# Patient Record
Sex: Female | Born: 1965 | Race: Black or African American | Hispanic: No | Marital: Single | Smoking: Former smoker
Health system: Southern US, Community
[De-identification: ages and names within clinical notes are randomized; demographics above are authoritative.]

## PROBLEM LIST (undated history)

## (undated) DIAGNOSIS — M545 Low back pain, unspecified: Secondary | ICD-10-CM

## (undated) DIAGNOSIS — G5602 Carpal tunnel syndrome, left upper limb: Secondary | ICD-10-CM

## (undated) DIAGNOSIS — G8929 Other chronic pain: Secondary | ICD-10-CM

## (undated) DIAGNOSIS — E119 Type 2 diabetes mellitus without complications: Secondary | ICD-10-CM

## (undated) DIAGNOSIS — D649 Anemia, unspecified: Secondary | ICD-10-CM

## (undated) HISTORY — PX: REDUCTION MAMMAPLASTY: SUR839

---
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
## 1997-11-01 ENCOUNTER — Encounter: Admission: RE | Admit: 1997-11-01 | Discharge: 1997-11-01 | Payer: Self-pay | Admitting: Sports Medicine

## 1997-11-01 ENCOUNTER — Other Ambulatory Visit: Admission: RE | Admit: 1997-11-01 | Discharge: 1997-11-01 | Payer: Self-pay | Admitting: *Deleted

## 1997-11-23 ENCOUNTER — Encounter: Admission: RE | Admit: 1997-11-23 | Discharge: 1997-11-23 | Payer: Self-pay | Admitting: Family Medicine

## 1998-05-11 ENCOUNTER — Encounter: Admission: RE | Admit: 1998-05-11 | Discharge: 1998-05-11 | Payer: Self-pay | Admitting: Sports Medicine

## 1998-06-26 ENCOUNTER — Encounter: Payer: Self-pay | Admitting: Emergency Medicine

## 1998-06-26 ENCOUNTER — Emergency Department (HOSPITAL_COMMUNITY): Admission: EM | Admit: 1998-06-26 | Discharge: 1998-06-26 | Payer: Self-pay | Admitting: Emergency Medicine

## 1999-02-07 ENCOUNTER — Emergency Department (HOSPITAL_COMMUNITY): Admission: EM | Admit: 1999-02-07 | Discharge: 1999-02-07 | Payer: Self-pay | Admitting: Emergency Medicine

## 1999-03-30 ENCOUNTER — Emergency Department (HOSPITAL_COMMUNITY): Admission: EM | Admit: 1999-03-30 | Discharge: 1999-03-31 | Payer: Self-pay | Admitting: Emergency Medicine

## 1999-04-03 ENCOUNTER — Encounter: Admission: RE | Admit: 1999-04-03 | Discharge: 1999-04-03 | Payer: Self-pay | Admitting: Sports Medicine

## 2000-03-21 ENCOUNTER — Encounter (INDEPENDENT_AMBULATORY_CARE_PROVIDER_SITE_OTHER): Payer: Self-pay | Admitting: *Deleted

## 2000-03-21 LAB — CONVERTED CEMR LAB

## 2000-03-31 ENCOUNTER — Other Ambulatory Visit: Admission: RE | Admit: 2000-03-31 | Discharge: 2000-03-31 | Payer: Self-pay | Admitting: *Deleted

## 2000-03-31 ENCOUNTER — Encounter: Admission: RE | Admit: 2000-03-31 | Discharge: 2000-03-31 | Payer: Self-pay | Admitting: Family Medicine

## 2000-04-07 ENCOUNTER — Encounter: Admission: RE | Admit: 2000-04-07 | Discharge: 2000-04-07 | Payer: Self-pay | Admitting: Family Medicine

## 2000-04-08 ENCOUNTER — Encounter: Admission: RE | Admit: 2000-04-08 | Discharge: 2000-04-08 | Payer: Self-pay | Admitting: Sports Medicine

## 2000-04-08 ENCOUNTER — Encounter: Payer: Self-pay | Admitting: Sports Medicine

## 2000-04-15 ENCOUNTER — Ambulatory Visit (HOSPITAL_COMMUNITY): Admission: RE | Admit: 2000-04-15 | Discharge: 2000-04-15 | Payer: Self-pay | Admitting: Sports Medicine

## 2000-04-15 ENCOUNTER — Encounter: Admission: RE | Admit: 2000-04-15 | Discharge: 2000-04-15 | Payer: Self-pay | Admitting: Sports Medicine

## 2000-08-16 ENCOUNTER — Emergency Department (HOSPITAL_COMMUNITY): Admission: EM | Admit: 2000-08-16 | Discharge: 2000-08-17 | Payer: Self-pay | Admitting: Emergency Medicine

## 2000-08-17 ENCOUNTER — Encounter: Payer: Self-pay | Admitting: Emergency Medicine

## 2001-03-05 ENCOUNTER — Encounter: Admission: RE | Admit: 2001-03-05 | Discharge: 2001-03-05 | Payer: Self-pay | Admitting: Family Medicine

## 2001-07-24 ENCOUNTER — Encounter: Payer: Self-pay | Admitting: Emergency Medicine

## 2001-07-24 ENCOUNTER — Emergency Department (HOSPITAL_COMMUNITY): Admission: EM | Admit: 2001-07-24 | Discharge: 2001-07-24 | Payer: Self-pay | Admitting: Emergency Medicine

## 2002-03-17 ENCOUNTER — Emergency Department (HOSPITAL_COMMUNITY): Admission: EM | Admit: 2002-03-17 | Discharge: 2002-03-17 | Payer: Self-pay | Admitting: Emergency Medicine

## 2002-03-17 ENCOUNTER — Encounter: Payer: Self-pay | Admitting: Emergency Medicine

## 2004-06-04 ENCOUNTER — Emergency Department (HOSPITAL_COMMUNITY): Admission: EM | Admit: 2004-06-04 | Discharge: 2004-06-04 | Payer: Self-pay | Admitting: *Deleted

## 2006-04-17 DIAGNOSIS — D62 Acute posthemorrhagic anemia: Secondary | ICD-10-CM

## 2006-04-17 DIAGNOSIS — E669 Obesity, unspecified: Secondary | ICD-10-CM | POA: Insufficient documentation

## 2006-04-17 DIAGNOSIS — K210 Gastro-esophageal reflux disease with esophagitis: Secondary | ICD-10-CM

## 2006-04-18 ENCOUNTER — Encounter (INDEPENDENT_AMBULATORY_CARE_PROVIDER_SITE_OTHER): Payer: Self-pay | Admitting: *Deleted

## 2006-04-22 ENCOUNTER — Emergency Department (HOSPITAL_COMMUNITY): Admission: EM | Admit: 2006-04-22 | Discharge: 2006-04-22 | Payer: Self-pay | Admitting: Emergency Medicine

## 2006-08-04 ENCOUNTER — Encounter: Admission: RE | Admit: 2006-08-04 | Discharge: 2006-08-04 | Payer: Self-pay | Admitting: Orthopedic Surgery

## 2007-05-25 ENCOUNTER — Encounter: Admission: RE | Admit: 2007-05-25 | Discharge: 2007-05-25 | Payer: Self-pay | Admitting: Obstetrics and Gynecology

## 2007-06-25 ENCOUNTER — Encounter: Admission: RE | Admit: 2007-06-25 | Discharge: 2007-06-25 | Payer: Self-pay | Admitting: Obstetrics and Gynecology

## 2008-01-01 ENCOUNTER — Encounter: Admission: RE | Admit: 2008-01-01 | Discharge: 2008-01-01 | Payer: Self-pay | Admitting: Obstetrics and Gynecology

## 2008-02-19 HISTORY — PX: CARPAL TUNNEL RELEASE: SHX101

## 2008-05-25 ENCOUNTER — Encounter: Admission: RE | Admit: 2008-05-25 | Discharge: 2008-05-25 | Payer: Self-pay | Admitting: Obstetrics and Gynecology

## 2008-07-19 HISTORY — PX: ANTERIOR CERVICAL DECOMP/DISCECTOMY FUSION: SHX1161

## 2008-08-16 ENCOUNTER — Ambulatory Visit (HOSPITAL_COMMUNITY): Admission: RE | Admit: 2008-08-16 | Discharge: 2008-08-17 | Payer: Self-pay | Admitting: Neurosurgery

## 2008-10-19 HISTORY — PX: VAGINAL HYSTERECTOMY: SUR661

## 2008-11-09 ENCOUNTER — Encounter (INDEPENDENT_AMBULATORY_CARE_PROVIDER_SITE_OTHER): Payer: Self-pay | Admitting: Obstetrics and Gynecology

## 2008-11-09 ENCOUNTER — Ambulatory Visit (HOSPITAL_COMMUNITY): Admission: RE | Admit: 2008-11-09 | Discharge: 2008-11-10 | Payer: Self-pay | Admitting: Obstetrics and Gynecology

## 2008-11-18 ENCOUNTER — Ambulatory Visit (HOSPITAL_BASED_OUTPATIENT_CLINIC_OR_DEPARTMENT_OTHER): Admission: RE | Admit: 2008-11-18 | Discharge: 2008-11-18 | Payer: Self-pay | Admitting: Orthopedic Surgery

## 2008-11-18 HISTORY — PX: SHOULDER ARTHROSCOPY W/ ROTATOR CUFF REPAIR: SHX2400

## 2008-12-10 ENCOUNTER — Emergency Department (HOSPITAL_COMMUNITY): Admission: EM | Admit: 2008-12-10 | Discharge: 2008-12-10 | Payer: Self-pay | Admitting: Family Medicine

## 2009-02-23 ENCOUNTER — Encounter: Admission: RE | Admit: 2009-02-23 | Discharge: 2009-02-23 | Payer: Self-pay | Admitting: Family Medicine

## 2009-06-26 ENCOUNTER — Encounter: Admission: RE | Admit: 2009-06-26 | Discharge: 2009-06-26 | Payer: Self-pay | Admitting: Obstetrics and Gynecology

## 2010-02-18 HISTORY — PX: SHOULDER OPEN ROTATOR CUFF REPAIR: SHX2407

## 2010-02-22 ENCOUNTER — Ambulatory Visit
Admission: RE | Admit: 2010-02-22 | Discharge: 2010-02-22 | Payer: Self-pay | Source: Home / Self Care | Attending: Orthopedic Surgery | Admitting: Orthopedic Surgery

## 2010-02-22 LAB — POCT HEMOGLOBIN-HEMACUE: Hemoglobin: 13.8 g/dL (ref 12.0–15.0)

## 2010-05-24 LAB — POCT HEMOGLOBIN-HEMACUE: Hemoglobin: 8.9 g/dL — ABNORMAL LOW (ref 12.0–15.0)

## 2010-05-24 LAB — POCT I-STAT, CHEM 8
BUN: 7 mg/dL (ref 6–23)
Calcium, Ion: 1.18 mmol/L (ref 1.12–1.32)
Chloride: 103 mEq/L (ref 96–112)
Glucose, Bld: 87 mg/dL (ref 70–99)
HCT: 29 % — ABNORMAL LOW (ref 36.0–46.0)
TCO2: 25 mmol/L (ref 0–100)

## 2010-05-25 LAB — CBC
MCHC: 32.4 g/dL (ref 30.0–36.0)
MCV: 80.1 fL (ref 78.0–100.0)
Platelets: 407 10*3/uL — ABNORMAL HIGH (ref 150–400)
Platelets: 416 10*3/uL — ABNORMAL HIGH (ref 150–400)
RBC: 3.22 MIL/uL — ABNORMAL LOW (ref 3.87–5.11)
RBC: 3.89 MIL/uL (ref 3.87–5.11)
WBC: 15.3 10*3/uL — ABNORMAL HIGH (ref 4.0–10.5)

## 2010-05-25 LAB — BASIC METABOLIC PANEL
BUN: 7 mg/dL (ref 6–23)
CO2: 26 mEq/L (ref 19–32)
Calcium: 9 mg/dL (ref 8.4–10.5)
Chloride: 105 mEq/L (ref 96–112)
Creatinine, Ser: 0.46 mg/dL (ref 0.4–1.2)
GFR calc Af Amer: >60 mL/min (ref >60)

## 2010-05-28 LAB — BASIC METABOLIC PANEL
CO2: 26 mEq/L (ref 19–32)
Chloride: 106 mEq/L (ref 96–112)
GFR calc Af Amer: >60 mL/min (ref >60)
Potassium: 4 mEq/L (ref 3.5–5.1)
Sodium: 139 mEq/L (ref 135–145)

## 2010-05-28 LAB — CBC
HCT: 30.4 % — ABNORMAL LOW (ref 36.0–46.0)
Hemoglobin: 10 g/dL — ABNORMAL LOW (ref 12.0–15.0)
MCHC: 32.7 g/dL (ref 30.0–36.0)
MCV: 79.4 fL (ref 78.0–100.0)
RBC: 3.84 MIL/uL — ABNORMAL LOW (ref 3.87–5.11)
WBC: 8.9 10*3/uL (ref 4.0–10.5)

## 2010-05-28 IMAGING — MG MM DIAGNOSTIC BILATERAL
6 series · 6 of 6 positions shown · non-contrast
Comparison: With priors

CLINICAL DATA: Short-term interval follow-up of the left breast

DIGITAL DIAGNOSTIC BILATERAL MAMMOGRAM WITH CAD

[R CC (1 of 2)]
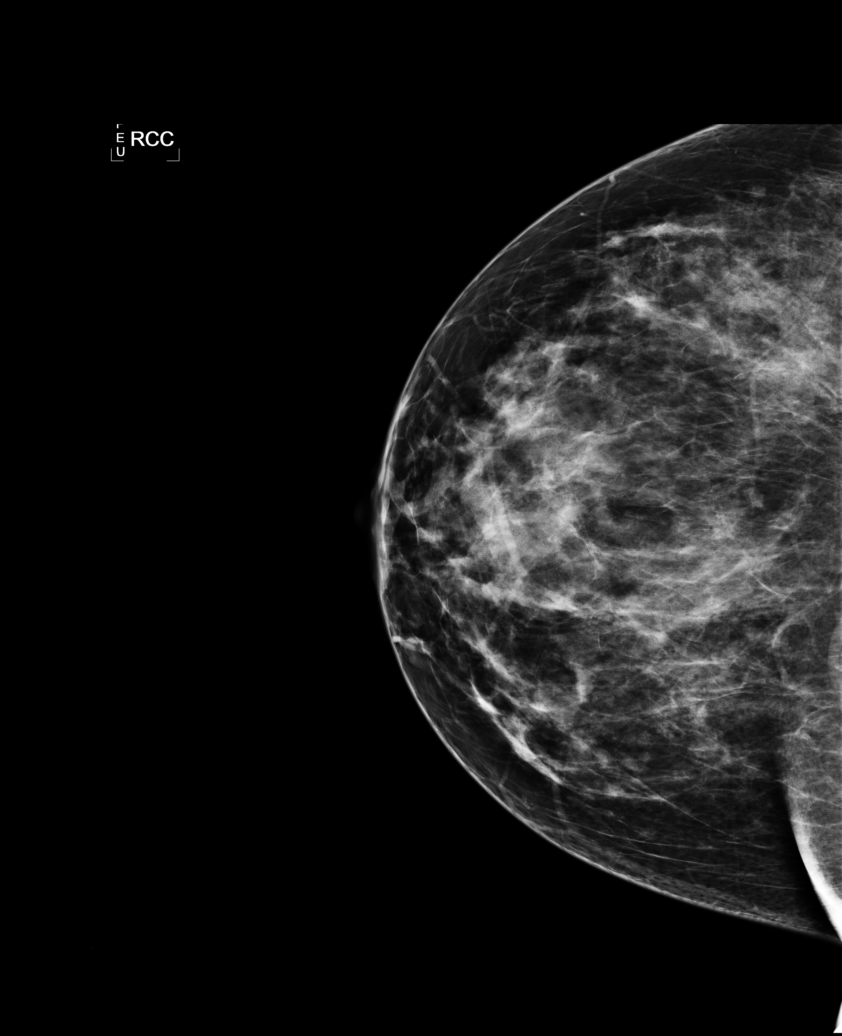

[L CC]
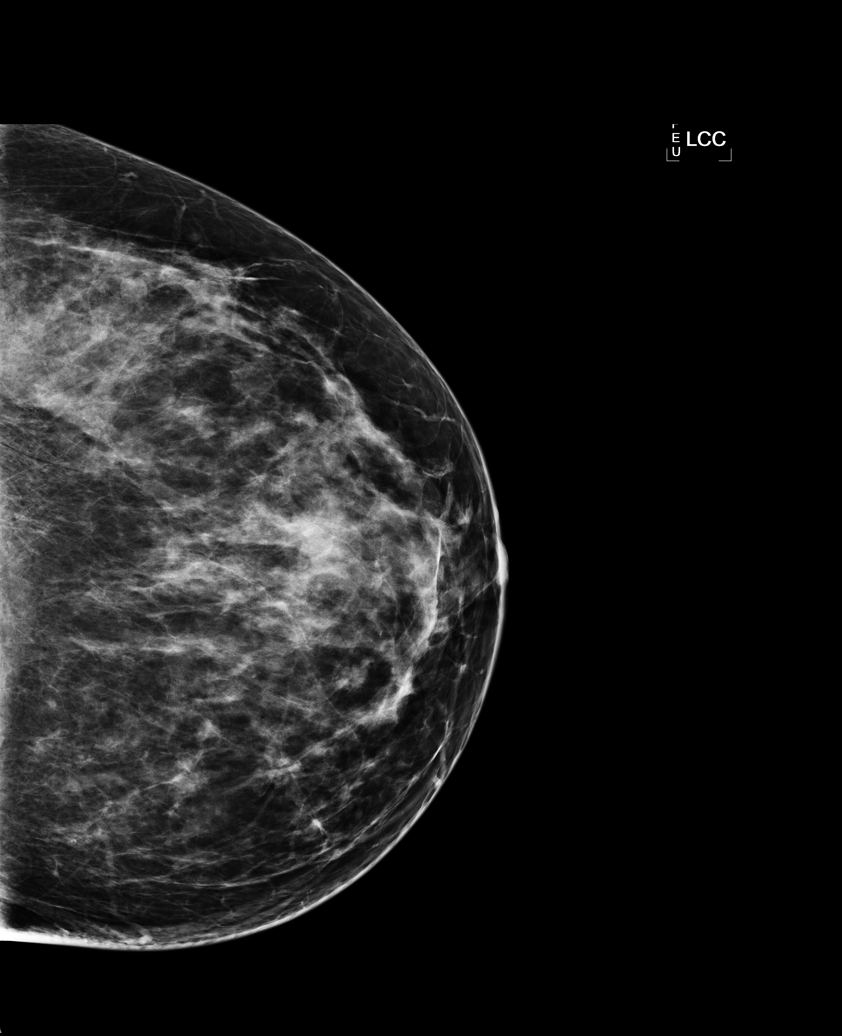

[L MLO]
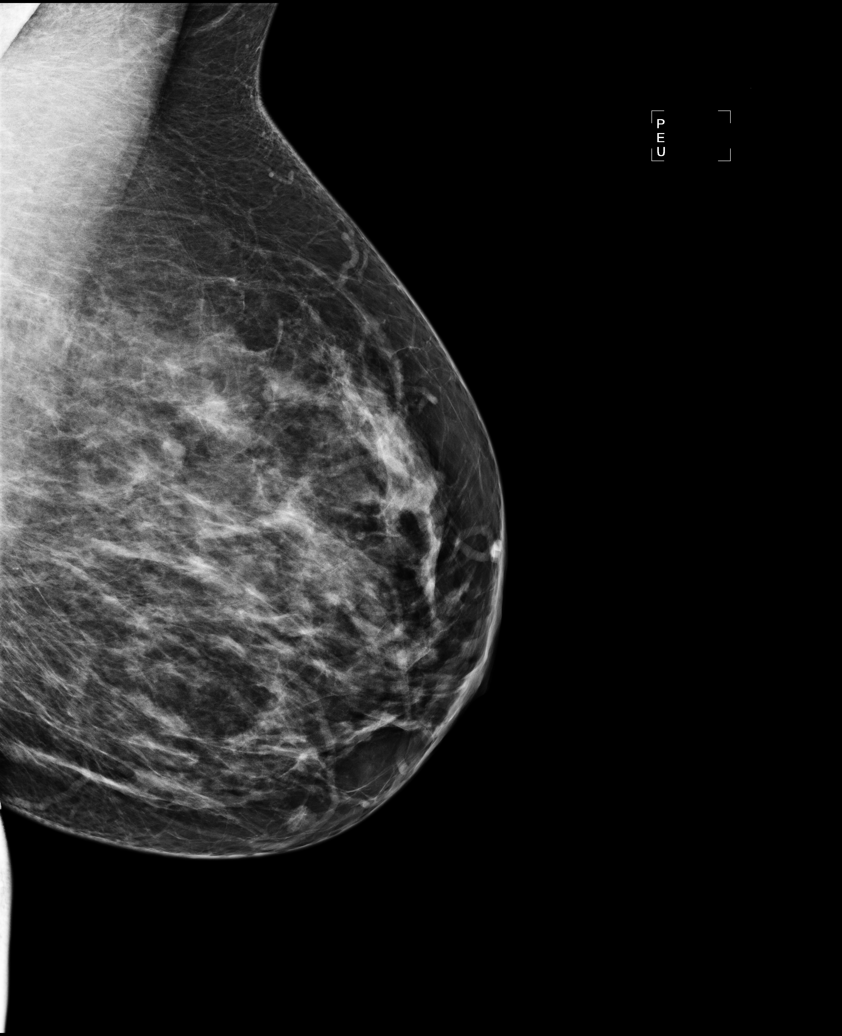

[R MLO (1 of 2)]
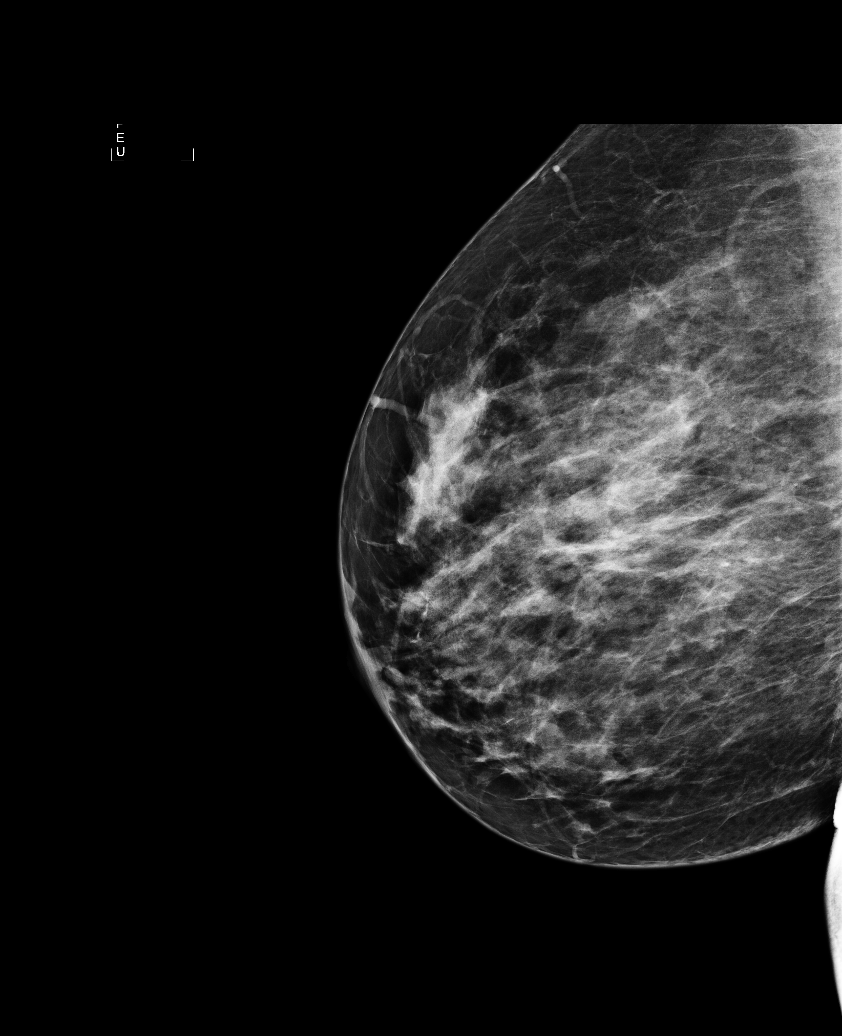

[R CC (2 of 2)]
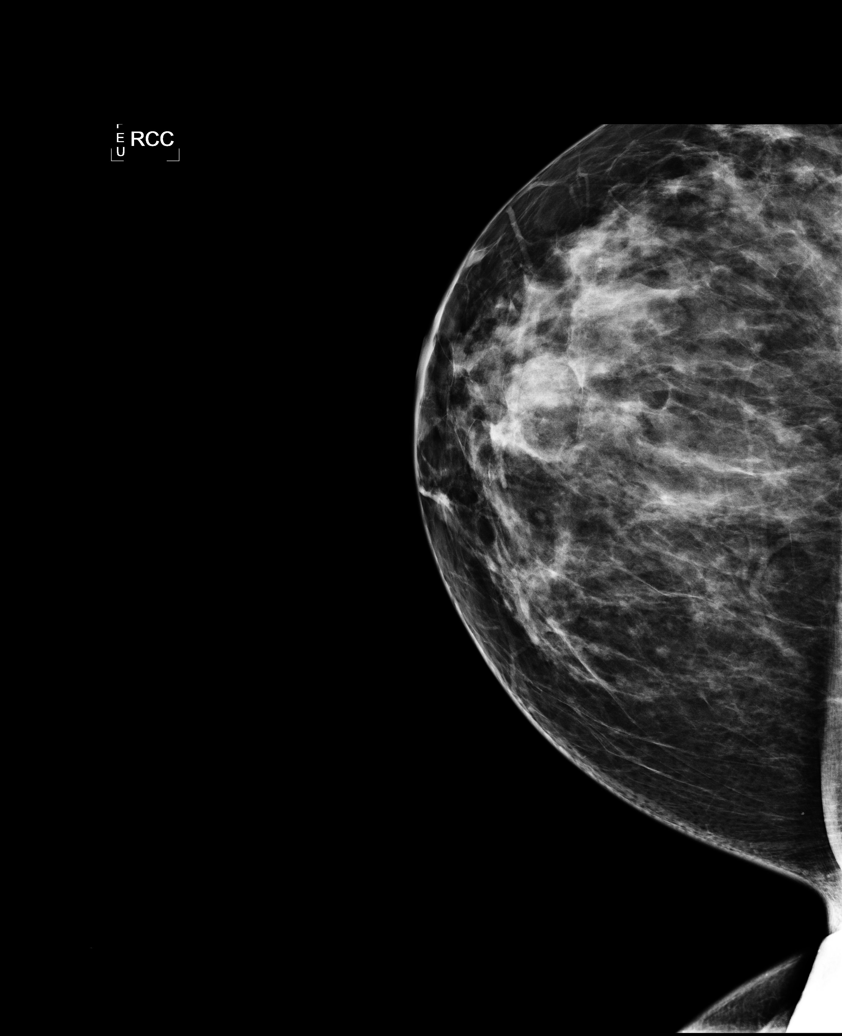

[R MLO (2 of 2)]
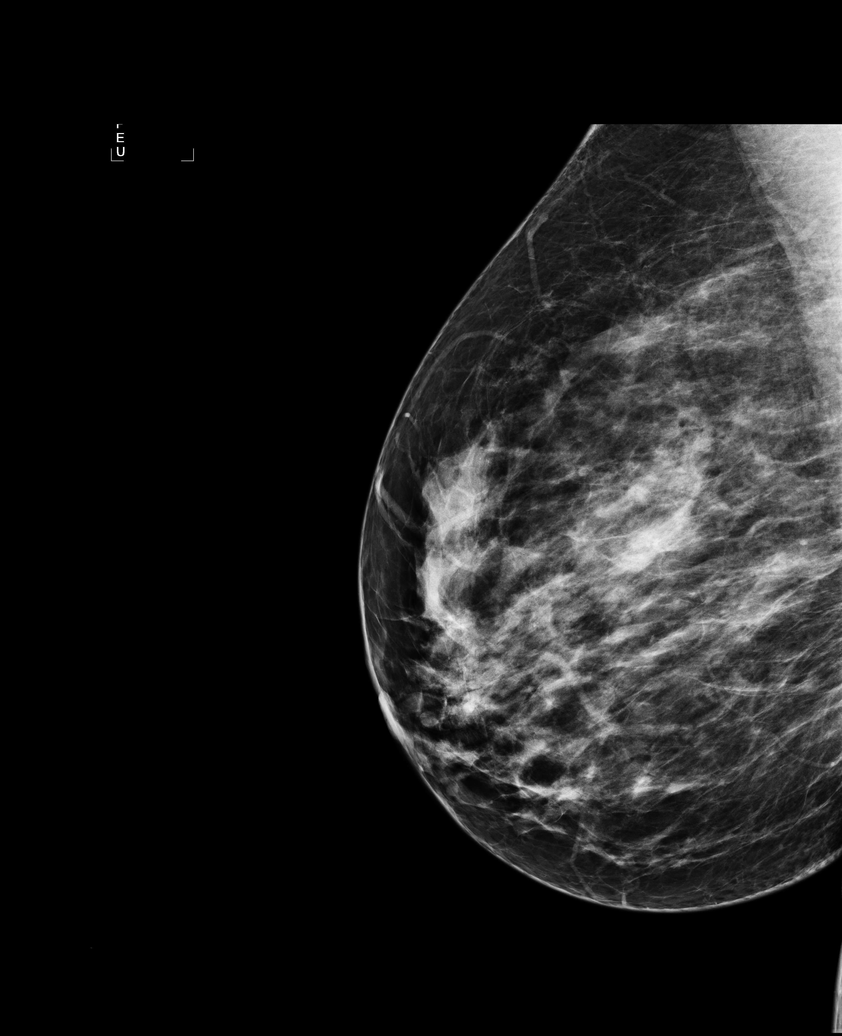

[6 of 6 positions shown; findings below may reference images not displayed]

FINDINGS: There are scattered fibroglandular densities.  No new
suspicious mass or malignant-type microcalcifications are seen in
either breast.  The 8 mm nodule seen in the inferior aspect of the
left breast is unchanged when compared to the mammogram dated
05/25/2007 and is felt to be benign.
IMPRESSION: No evidence of malignancy in either breast.  Screening mammogram in
1 year is recommended.

BI-RADS CATEGORY 2:  Benign finding(s).

## 2010-07-03 NOTE — Op Note (Signed)
[name], [name]               ACCOUNT NO.:  [id number]   MEDICAL RECORD NO.:  [id number]          PATIENT TYPE:  AMB   LOCATION:  SDS                          FACILITY:  [facility]   PHYSICIAN:  Danae Orleans. Venetia Maxon, M.D.  DATE OF BIRTH:  07/05/1965   DATE OF PROCEDURE:  08/16/2008  DATE OF DISCHARGE:                               OPERATIVE REPORT   PREOPERATIVE DIAGNOSES:  Herniated cervical disk spondylosis,  degenerative disease, and radiculopathy, C4-C5 and C5-C6 levels.   POSTOPERATIVE DIAGNOSES:  Herniated cervical disk spondylosis,  degenerative disease, and radiculopathy, C4-C5 and C5-C6 levels.   PROCEDURE:  Anterior cervical decompression and fusion C4-C5 and C5-C6  with allograft bone graft, morselized bone autograft, EquivaBone, and  anterior cervical plate.   SURGEON:  Danae Orleans. Venetia Maxon, MD   ASSISTANTS:  1. Coletta Memos, MD  2. Georgiann Cocker, RN   ANESTHESIA:  General endotracheal anesthesia.   ESTIMATED BLOOD LOSS:  Minimal.   COMPLICATIONS:  None.   DISPOSITION:  Recovery.   INDICATIONS:  [name] is a 45 year old woman with left C6  radiculopathy and right C5 radiculopathy, with herniated cervical disk  at C4-C5 and C5-C6 levels.  It was elected take her to surgery for  anterior cervical decompression and fusion at these affected levels.   PROCEDURE IN DETAIL:  [name] was brought to the operating room.  Following a satisfactory and uncomplicated induction of general  endotracheal anesthesia and placement of intravenous lines, the patient  was placed in supine position on the operating table.  The neck was  placed in slight extension.  She was placed in 5 pounds of halter  traction.  Her neck was then prepped and draped in the usual sterile  fashion.  The area of planned incision was infiltrated with lidocaine.  Incision was made from midline to the anterior border of  sternocleidomastoid muscle on the left side of midline and carried  through sharply  through the platysma layer.  Subplatysmal dissection was  performed exposing the anterior borders of sternocleidomastoid muscle  using blunt dissection.  The carotid sheath was kept lateral and trachea  and esophagus kept medial exposing the anterior cervical spine.  Bent  spinal needles were placed what we felt to be the C4-C5 and C5-C6 levels  and this was confirmed on intraoperative x-ray.  Subsequently longus  colli muscles were taken down from the anterior cervical spine from C4  to C6 bilaterally using electrocautery and Key elevator.  A self-  retaining Shadow-Line retractor was placed along up and down retractors.  The interspaces at C4-C5 and C5-C6 were incised with #15 blade.  Disk  material was removed in piecemeal fashion.  Distraction pins were placed  at C4 and C6 and using gentle distraction, the interspaces were opened  and cleared of investing disk and cartilaginous material using a variety  of Epstein curettes.  Endplates were decorticated with high-speed drill  and uncinate spurs were drilled down initially using the operating  microscope at C5-C6 level.  The posterior longitudinal ligament was  detached and removed in a piecemeal fashion.  The spinal cord,  dura, and  both C6 nerve roots were decompressed.  There was significant amount of  spondylitic disk material overlying the C6 nerve root on the left and  this was decompressed widely.  Hemostasis was assured and after trial  sizing with a 7-mm allograft bone wedge which was selected and fashioned  with high-speed drill, packed with morselized bone autograft, Equivabone  inserted in the interspace, and countersunk appropriately.  At the C4-C5  level, similar decompression was performed.  Right-sided disk herniation  was removed at this level and spinal cord dura and both C5 nerve roots  were decompressed.  Hemostasis was again assured and a similar sized  bone graft was fashioned, packed with morselized bone  autograft,  EquivaBone inserted in the interspace, and countersunk appropriately.  A  28-mm anterior cervical spine plate was (Trestle) was affixed to the  anterior cervical spine using 40-mm variable angle screws, two at C4,  two at C5, and two at C6, all screws had excellent purchase.  Locking  mechanisms were engaged.  The traction weight was removed prior to  placing the plate.  Final x-ray was obtained which showed upper aspect  of construct to be well-positioned.  The lower aspect was not well  visualized because of the patient's large body habitus.  The soft  tissues were inspected and found to be in good repair.  Hemostasis was  assured with bipolar electrocautery.  The wound was irrigated.  The  platysma layer was closed with 3-0 Vicryl sutures.  Skin edges were  reapproximated with 3-0 Vicryl subcuticular stitch.  The wound was  dressed with Dermabond.  The patient was extubated in the operating room  and taken to recovery in stable and satisfactory condition having  tolerated the operation well.  Counts were correct at the end of the  case.      Danae Orleans. Venetia Maxon, M.D.  Electronically Signed     JDS/MEDQ  D:  08/16/2008  T:  08/17/2008  Job:  [id number]

## 2010-07-10 ENCOUNTER — Other Ambulatory Visit: Payer: Self-pay | Admitting: Obstetrics and Gynecology

## 2010-07-10 DIAGNOSIS — Z1231 Encounter for screening mammogram for malignant neoplasm of breast: Secondary | ICD-10-CM

## 2010-07-20 ENCOUNTER — Ambulatory Visit: Payer: Self-pay

## 2010-07-26 ENCOUNTER — Ambulatory Visit: Payer: Self-pay

## 2010-07-30 ENCOUNTER — Ambulatory Visit
Admission: RE | Admit: 2010-07-30 | Discharge: 2010-07-30 | Disposition: A | Discharge status: Home / Self Care | Payer: BC Managed Care – PPO | Source: Ambulatory Visit | Attending: Obstetrics and Gynecology | Admitting: Obstetrics and Gynecology

## 2010-07-30 DIAGNOSIS — Z1231 Encounter for screening mammogram for malignant neoplasm of breast: Secondary | ICD-10-CM

## 2010-11-10 ENCOUNTER — Inpatient Hospital Stay (INDEPENDENT_AMBULATORY_CARE_PROVIDER_SITE_OTHER)
Admission: RE | Admit: 2010-11-10 | Discharge: 2010-11-10 | Disposition: A | Discharge status: Home / Self Care | Payer: BC Managed Care – PPO | Source: Ambulatory Visit | Attending: Emergency Medicine | Admitting: Emergency Medicine

## 2010-11-10 ENCOUNTER — Ambulatory Visit (INDEPENDENT_AMBULATORY_CARE_PROVIDER_SITE_OTHER): Payer: BC Managed Care – PPO

## 2010-11-10 DIAGNOSIS — J069 Acute upper respiratory infection, unspecified: Secondary | ICD-10-CM

## 2010-11-10 DIAGNOSIS — K5289 Other specified noninfective gastroenteritis and colitis: Secondary | ICD-10-CM

## 2010-11-10 DIAGNOSIS — J40 Bronchitis, not specified as acute or chronic: Secondary | ICD-10-CM

## 2010-11-10 LAB — CBC
MCH: 31.4 pg (ref 26.0–34.0)
MCHC: 34.4 g/dL (ref 30.0–36.0)
MCV: 91.3 fL (ref 78.0–100.0)
Platelets: 332 10*3/uL (ref 150–400)
RBC: 4.14 MIL/uL (ref 3.87–5.11)

## 2010-11-10 LAB — DIFFERENTIAL
Eosinophils Absolute: 0 10*3/uL (ref 0.0–0.7)
Lymphs Abs: 1.5 10*3/uL (ref 0.7–4.0)
Monocytes Absolute: 0.7 10*3/uL (ref 0.1–1.0)
Monocytes Relative: 7 % (ref 3–12)
Neutrophils Relative %: 78 % — ABNORMAL HIGH (ref 43–77)

## 2010-11-10 LAB — BASIC METABOLIC PANEL
CO2: 25 mEq/L (ref 19–32)
Calcium: 9.6 mg/dL (ref 8.4–10.5)
Creatinine, Ser: <0.47 mg/dL — ABNORMAL LOW (ref 0.50–1.10)
Glucose, Bld: 83 mg/dL (ref 70–99)

## 2011-04-11 ENCOUNTER — Other Ambulatory Visit: Payer: Self-pay | Admitting: Neurosurgery

## 2011-04-25 ENCOUNTER — Encounter (HOSPITAL_COMMUNITY): Payer: Self-pay

## 2011-04-25 ENCOUNTER — Encounter (HOSPITAL_COMMUNITY)
Admission: RE | Admit: 2011-04-25 | Discharge: 2011-04-25 | Disposition: A | Discharge status: Home / Self Care | Payer: Worker's Compensation | Source: Ambulatory Visit | Attending: Neurosurgery | Admitting: Neurosurgery

## 2011-04-25 HISTORY — DX: Anemia, unspecified: D64.9

## 2011-04-25 LAB — HCG, SERUM, QUALITATIVE: Preg, Serum: NEGATIVE

## 2011-04-25 LAB — CBC
Platelets: 302 10*3/uL (ref 150–400)
RDW: 13.5 % (ref 11.5–15.5)
WBC: 9.7 10*3/uL (ref 4.0–10.5)

## 2011-04-25 LAB — BASIC METABOLIC PANEL
Calcium: 9.9 mg/dL (ref 8.4–10.5)
Creatinine, Ser: 0.62 mg/dL (ref 0.50–1.10)
GFR calc Af Amer: >90 mL/min (ref >90)
GFR calc non Af Amer: >90 mL/min (ref >90)
Sodium: 140 mEq/L (ref 135–145)

## 2011-04-25 LAB — TYPE AND SCREEN
ABO/RH(D): A POS
Antibody Screen: NEGATIVE

## 2011-04-25 LAB — SURGICAL PCR SCREEN
MRSA, PCR: NEGATIVE
Staphylococcus aureus: NEGATIVE

## 2011-04-25 NOTE — Pre-Procedure Instructions (Signed)
20 [name]   04/25/2011   Your procedure is scheduled on: Friday, MARCH 15TH   Report to [facility] at  5:30  AM.   Call this number if you have problems the morning of surgery: [phone]   Remember:   Do not eat food:After Midnight. Thursday   May have clear liquids: up to 4 Hours before arrival TIME 1:30 AM   Clear liquids include soda, tea, black coffee, apple or grape juice, broth.   Take these medicines the morning of surgery with A SIP OF WATER:  ROBAXIN   Do not wear jewelry, make-up or nail polish.   Do not wear lotions, powders, or perfumes. You may wear deodorant.   Do not shave 48 hours prior to surgery.   Do not bring valuables to the hospital.  Contacts, dentures or bridgework may not be worn into surgery.  Leave suitcase in the car. After surgery it may be brought to your room.  For patients admitted to the hospital, checkout time is 11:00 AM the day of discharge.   Patients discharged the day of surgery will not be allowed to drive home.  Name and phone number of your driver: [name] --  DAUGHTER   Special Instructions: CHG Shower Use Special Wash: 1/2 bottle night before surgery and 1/2 bottle morning of surgery.   Please read over the following fact sheets that you were given: Pain Booklet, Blood Transfusion Information, MRSA Information and Surgical Site Infection Prevention

## 2011-04-26 ENCOUNTER — Encounter (HOSPITAL_COMMUNITY): Payer: Self-pay | Admitting: Pharmacy Technician

## 2011-05-02 MED ORDER — CEFAZOLIN SODIUM-DEXTROSE 2-3 GM-% IV SOLR
2.0000 g | INTRAVENOUS | Status: AC
  Administered 2011-05-03: 2 g via INTRAVENOUS
  Filled 2011-05-02: qty 50

## 2011-05-02 NOTE — H&P (Signed)
[name] #[id number] DOB: March 03, 1965   [name] returns today. Her MRI of her thoracic spine is fairly unremarkable apart from shallow right paracentral disc protrusion at T7-T8. Her lumbar MRI shows that she has disc desiccation and circumferential disc bulge at L4-5 with severe facet and ligamentous hypertrophy with fluid in both facet joints and a synovial cyst measuring 6 x 4 x 8 mm. on the left causing left-sided spondylosis and stenosis. There is some milder arthritis at L3-4, but this does not appear to be severe.  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 on extension, 8 mm. on flexion, 7 mm. on neutral alignment.  I believe that this spondylosis and degenerative changes on the right are the basis for her severe back and leg pain. I have recommended based on the fact that she did not improve with injections and nonsurgical management with her shoulder and also with her low back, as well as physical therapy with her low back that she undergo decompression and fusion at the L4-5 level. She says she is in quite severe pain and this is very much affecting her quality of life and ability to function. We will await Workers' Comp authorization before proceeding. She has bilateral lower extremity radicular pain. She also is complaining of low back pain. Pending approval, we will go ahead and fit her for a LSO brace and proceed with decompression and fusion at the L4-5 level.  [name]. [name], M.D./sv cc: Dr. Josephine Igo Dr. Mila Palmer Lavella Lemons, RN, Case Manager, Fax - [phone]    NEUROSURGICAL CONSULTATION   [name] #[id number] DOB: Mar 02, 1965 Jun 22, 2008  HISTORY OF PRESENT ILLNESS: [name] is a 46 year old woman with neck pain who currently works as a Engineer, mining for [employer] who complains of left-sided neck and left arm pain. She notes that she slipped and fell  backwards on 03/19/2008. Since that time she has had left shoulder and left neck pain ranging from 8 to 9 to 10 out of 10 in severity. She has been taking Vicodin every 2 to 3 x per day after work which helps "put me to sleep". She has also used a TENS unit. She saw Dr. Mina Marble and had physical therapy with Dr. Mina Marble. She is currently working with 20-lb. lifting restriction by Dr. Mina Marble. She complains of left-sided pain into her arm and hand since her fall. She has some milder right arm pain.  DIAGNOSTIC STUDIES: Ms. Mini had an MRI of her cervical spine which was performed through [facility] on 05/25/2008 which shows a right-sided posterolateral disc protrusion at C4-5 with right foraminal stenosis and on the left at C5-6 she has posterolateral disc extrusion with associated disc and osteophyte complex and uncovertebral spurring causing left-sided narrowing of the left aspect of the central canal and left foraminal stenosis with left uncovertebral spurring contributing to foraminal stenosis.  REVIEW OF SYSTEMS: Review of Systems was reviewed with the patient. Pertinent positives include swelling in feet or hands, leg pain while walking, arm weakness, leg weakness, back pain, arm pain, joint pain or swelling, neck pain.  PAST MEDICAL HISTORY:   Current Medical Conditions: Significant for no other significant medical problems.   Medications and Allergies: Current medications include B12 Vitamin qd, Phendimetrazine 2 daily, Vicodin 1 or 2 prn and Naproxen bid.   Height and Weight: She is 5', 6 " tall and 189  lbs.  FAMILY HISTORY: Mother is 56 in reasonably good health.  SOCIAL HISTORY: She currently is a nonsmoker, nondrinker, no history of substance abuse.  PHYSICAL EXAMINATION:   General Appearance: [name] is a pleasant, cooperative woman in no acute distress.   Blood Pressure and Pulse: Her blood pressure is 128/88. Heart rate is 76 and regular.   HEENT - normocephalic,  atraumatic. The pupils are equal, round and reactive to light. The extraocular muscles are intact. Sclerae - white. Conjunctiva - pink. Oropharynx benign. Uvula midline.   Neck - left parascapular discomfort and positive left Spurling's maneuver. She has mildly positive left shoulder impingement testing.   Respiratory - there is normal respiratory effort with good intercostal function. Lungs are clear to auscultation. There are no rales, rhonchi or wheezes.   Cardiovascular - the heart has regular rate and rhythm to auscultation. No murmurs are appreciated. There is no extremity edema, cyanosis or clubbing. There are palpable pedal pulses.   Abdomen - soft, nontender, no hepatosplenomegaly appreciated or masses. There are active bowel sounds. No guarding or rebound.   Musculoskeletal Examination - mildly positive left shoulder impingement testing.  NEUROLOGICAL EXAMINATION: The patient is oriented to time, person and place and has good recall of both recent and remote memory with normal attention span and concentration. The patient speaks with clear and fluent speech and exhibits normal language function and appropriate fund of knowledge.   Cranial Nerve Examination - pupils are equal, round and reactive to light. Extraocular movements are full. Visual fields are full to confrontational testing. Facial sensation and facial movement are symmetric and intact. Hearing is intact to finger rub. Palate is upgoing. Shoulder shrug is symmetric. Tongue protrudes in the midline.   Motor Examination - motor strength is 5/5 in the bilateral upper extremities with the exception of 4/5 right deltoid strength, 4/5 left biceps strength, 4+/5 left wrist  extensor strength. In the lower extremities motor strength is 5/5 in hip flexion, extension, quadriceps, hamstrings, plantar flexion, dorsiflexion and extensor hallucis longus.   Sensory Examination - she has increased pin sensation in a right C5 distribution;  otherwise, intact sensation.   Deep Tendon Reflexes - 2 in the right biceps, trace at the left biceps, 2 at the triceps, 2 at the knees, 2 at the ankles and great toes are downgoing to plantar stimulation. Negative Hoffmann's signs.   Cerebellar Examination - normal coordination in upper and lower extremities and normal rapid alternating movements. Romberg test is negative.  IMPRESSION AND RECOMMENDATIONS:  [name] is a 46 year old woman with left greater than right arm pain. She has weakness in her left biceps and wrist extensor and also right-sided deltoid weakness. She has cervical disc herniation at C5-6 on the left which is the most significant finding. She also has right C4-5 posterolateral disc protrusion with right foraminal stenosis. She also has component of rotator cuff syndrome involving her left shoulder.  I recommended to Ms. Wahler, based on the severity of her weakness and significant pain complaints, as well as imaging findings, that she undergo anterior cervical decompression and fusion at C4-5 and C5-6 levels. I went over models and diagrams and reviewed her radiographs with her and discussed pros and cons of surgery, as well as nonsurgical treatment options. She is quite miserable in terms of her level of discomfort. She wishes to go ahead with surgery and we will try to get this approved in an expedited fashion. She is to continue on current work restrictions.  I  have recommended to the patient that she undergo anterior cervical diskectomy and fusion with plating. I went over the diagnostic studies in detail and reviewed surgical models and also discussed the exact nature of the surgical procedure, attendant risks, potential benefits and typical operative and postoperative course. I discussed the risks of surgery which include, but are not limited to, risks of anesthesia, blood loss, infection, injury to various neck structures including the trachea, esophagus, which could cause  temporary or permanent swallowing difficulties and also the potential for perforation of the esophagus which might require operative intervention, larynx, recurrent laryngeal nerve, which could cause either temporary or permanent vocal cord paralysis resulting in either temporary or permanent voice changes, injury to cervical nerve roots, which could cause either temporary or permanent arm pain, numbness and/or weakness. There is a  small chance of injury to the spinal cord which could cause paralysis. There is also the potential for malplacement of instrumentation, fusion failure, need for repeat surgery, degenerative disease at other levels in the neck, failure to relieve the pain, worsening of pain. I also discussed with the patient that she will lose some neck mobility from the surgery. It is typical to stay in the hospital overnight after this operation. Typically she will not be able to drive for 2 weeks after surgery and will come back to see me 2 weeks following surgery with a lateral C-spine x-ray and then for monthly visits to 3 months after surgery. Generally patients are out of work for 4 to 6 weeks following surgery. She will wear a soft collar for 2 weeks after surgery. She will need to be fitted for a soft collar before surgery   [facility]  [name]. [name], M.D.  JDS:sv   cc: Dr. Verlan Friends Dr. Mila Palmer

## 2011-05-03 ENCOUNTER — Encounter (HOSPITAL_COMMUNITY): Payer: Self-pay | Admitting: Anesthesiology

## 2011-05-03 ENCOUNTER — Ambulatory Visit (HOSPITAL_COMMUNITY): Payer: Worker's Compensation

## 2011-05-03 ENCOUNTER — Ambulatory Visit (HOSPITAL_COMMUNITY): Payer: Worker's Compensation | Admitting: Anesthesiology

## 2011-05-03 ENCOUNTER — Encounter (HOSPITAL_COMMUNITY): Payer: Self-pay | Admitting: *Deleted

## 2011-05-03 ENCOUNTER — Encounter (HOSPITAL_COMMUNITY)
Admission: RE | Disposition: A | Discharge status: Home / Self Care | Payer: Self-pay | Source: Ambulatory Visit | Attending: Neurosurgery

## 2011-05-03 ENCOUNTER — Inpatient Hospital Stay (HOSPITAL_COMMUNITY)
Admission: RE | Admit: 2011-05-03 | Discharge: 2011-05-07 | DRG: 460 | Disposition: A | Discharge status: Home / Self Care | Payer: Worker's Compensation | Source: Ambulatory Visit | Attending: Neurosurgery | Admitting: Neurosurgery

## 2011-05-03 DIAGNOSIS — K219 Gastro-esophageal reflux disease without esophagitis: Secondary | ICD-10-CM | POA: Diagnosis present

## 2011-05-03 DIAGNOSIS — M51379 Other intervertebral disc degeneration, lumbosacral region without mention of lumbar back pain or lower extremity pain: Secondary | ICD-10-CM | POA: Diagnosis present

## 2011-05-03 DIAGNOSIS — M4310 Spondylolisthesis, site unspecified: Principal | ICD-10-CM | POA: Diagnosis present

## 2011-05-03 DIAGNOSIS — M5137 Other intervertebral disc degeneration, lumbosacral region: Secondary | ICD-10-CM | POA: Diagnosis present

## 2011-05-03 DIAGNOSIS — M7130 Other bursal cyst, unspecified site: Secondary | ICD-10-CM | POA: Diagnosis present

## 2011-05-03 DIAGNOSIS — M542 Cervicalgia: Secondary | ICD-10-CM | POA: Diagnosis present

## 2011-05-03 DIAGNOSIS — M48061 Spinal stenosis, lumbar region without neurogenic claudication: Secondary | ICD-10-CM | POA: Diagnosis present

## 2011-05-03 HISTORY — DX: Carpal tunnel syndrome, left upper limb: G56.02

## 2011-05-03 HISTORY — DX: Low back pain: M54.5

## 2011-05-03 HISTORY — PX: POSTERIOR FUSION LUMBAR SPINE: SUR632

## 2011-05-03 HISTORY — DX: Other chronic pain: G89.29

## 2011-05-03 HISTORY — DX: Low back pain, unspecified: M54.50

## 2011-05-03 SURGERY — POSTERIOR LUMBAR FUSION 1 LEVEL
Anesthesia: General | Site: Back | Laterality: Bilateral | Wound class: Clean

## 2011-05-03 MED ORDER — DOCUSATE SODIUM 100 MG PO CAPS
100.0000 mg | ORAL_CAPSULE | Freq: Two times a day (BID) | ORAL | Status: DC
  Administered 2011-05-03 – 2011-05-07 (×9): 100 mg via ORAL
  Filled 2011-05-03 (×10): qty 1

## 2011-05-03 MED ORDER — MIDAZOLAM HCL 5 MG/5ML IJ SOLN
INTRAMUSCULAR | Status: DC | PRN
  Administered 2011-05-03: 2 mg via INTRAVENOUS

## 2011-05-03 MED ORDER — GLYCOPYRROLATE 0.2 MG/ML IJ SOLN
INTRAMUSCULAR | Status: DC | PRN
  Administered 2011-05-03: .6 mg via INTRAVENOUS

## 2011-05-03 MED ORDER — PHENOL 1.4 % MT LIQD: 1.0000 | OROMUCOSAL | Status: DC | PRN

## 2011-05-03 MED ORDER — MORPHINE SULFATE (PF) 1 MG/ML IV SOLN
INTRAVENOUS | Status: AC
  Administered 2011-05-03: 11:00:00 via INTRAVENOUS

## 2011-05-03 MED ORDER — LIDOCAINE-EPINEPHRINE 1 %-1:100000 IJ SOLN
INTRAMUSCULAR | Status: DC | PRN
  Administered 2011-05-03: 5 mL

## 2011-05-03 MED ORDER — MORPHINE SULFATE (PF) 1 MG/ML IV SOLN
INTRAVENOUS | Status: DC
  Administered 2011-05-03: 3 mg via INTRAVENOUS
  Administered 2011-05-03: 4.5 mg via INTRAVENOUS
  Administered 2011-05-03: 6 mg via INTRAVENOUS
  Administered 2011-05-04: 03:00:00 via INTRAVENOUS
  Administered 2011-05-04: 1.5 mg via INTRAVENOUS
  Administered 2011-05-04: 4.5 mg via INTRAVENOUS

## 2011-05-03 MED ORDER — POLYETHYLENE GLYCOL 3350 17 GM/SCOOP PO POWD
17.0000 g | Freq: Every day | ORAL | Status: DC
  Administered 2011-05-03 – 2011-05-07 (×5): 17 g via ORAL
  Filled 2011-05-03: qty 255

## 2011-05-03 MED ORDER — EPHEDRINE SULFATE 50 MG/ML IJ SOLN
INTRAMUSCULAR | Status: DC | PRN
  Administered 2011-05-03: 5 mg via INTRAVENOUS
  Administered 2011-05-03: 7.5 mg via INTRAVENOUS
  Administered 2011-05-03: 5 mg via INTRAVENOUS

## 2011-05-03 MED ORDER — SODIUM CHLORIDE 0.9 % IJ SOLN: 9.0000 mL | INTRAMUSCULAR | Status: DC | PRN

## 2011-05-03 MED ORDER — SODIUM CHLORIDE 0.9 % IV SOLN
INTRAVENOUS | Status: AC
  Filled 2011-05-03: qty 500

## 2011-05-03 MED ORDER — LIDOCAINE HCL (CARDIAC) 20 MG/ML IV SOLN
INTRAVENOUS | Status: DC | PRN
  Administered 2011-05-03 (×2): 50 mg via INTRAVENOUS

## 2011-05-03 MED ORDER — ROCURONIUM BROMIDE 100 MG/10ML IV SOLN
INTRAVENOUS | Status: DC | PRN
  Administered 2011-05-03: 50 mg via INTRAVENOUS

## 2011-05-03 MED ORDER — MORPHINE SULFATE 2 MG/ML IJ SOLN: 0.0500 mg/kg | INTRAMUSCULAR | Status: DC | PRN

## 2011-05-03 MED ORDER — ACETAMINOPHEN 325 MG PO TABS
650.0000 mg | ORAL_TABLET | ORAL | Status: DC | PRN
  Filled 2011-05-03: qty 2

## 2011-05-03 MED ORDER — BACITRACIN 50000 UNITS IM SOLR
INTRAMUSCULAR | Status: AC
  Filled 2011-05-03: qty 1

## 2011-05-03 MED ORDER — DEXAMETHASONE SODIUM PHOSPHATE 4 MG/ML IJ SOLN
INTRAMUSCULAR | Status: DC | PRN
  Administered 2011-05-03: 4 mg via INTRAVENOUS

## 2011-05-03 MED ORDER — ONDANSETRON HCL 4 MG/2ML IJ SOLN: 4.0000 mg | Freq: Four times a day (QID) | INTRAMUSCULAR | Status: DC | PRN

## 2011-05-03 MED ORDER — NALOXONE HCL 0.4 MG/ML IJ SOLN: 0.4000 mg | INTRAMUSCULAR | Status: DC | PRN

## 2011-05-03 MED ORDER — NEOSTIGMINE METHYLSULFATE 1 MG/ML IJ SOLN
INTRAMUSCULAR | Status: DC | PRN
  Administered 2011-05-03: 4 mg via INTRAVENOUS

## 2011-05-03 MED ORDER — DIPHENHYDRAMINE HCL 12.5 MG/5ML PO ELIX: 12.5000 mg | ORAL_SOLUTION | Freq: Four times a day (QID) | ORAL | Status: DC | PRN

## 2011-05-03 MED ORDER — SODIUM CHLORIDE 0.9 % IV SOLN: 250.0000 mL | INTRAVENOUS | Status: DC

## 2011-05-03 MED ORDER — SODIUM CHLORIDE 0.9 % IR SOLN
Status: DC | PRN
  Administered 2011-05-03: 08:00:00

## 2011-05-03 MED ORDER — DIPHENHYDRAMINE HCL 50 MG/ML IJ SOLN: 12.5000 mg | Freq: Four times a day (QID) | INTRAMUSCULAR | Status: DC | PRN

## 2011-05-03 MED ORDER — KCL IN DEXTROSE-NACL 20-5-0.45 MEQ/L-%-% IV SOLN
INTRAVENOUS | Status: DC
  Administered 2011-05-03 – 2011-05-04 (×2): via INTRAVENOUS
  Filled 2011-05-03 (×9): qty 1000

## 2011-05-03 MED ORDER — ACETAMINOPHEN 650 MG RE SUPP: 650.0000 mg | RECTAL | Status: DC | PRN

## 2011-05-03 MED ORDER — HYDROMORPHONE HCL PF 1 MG/ML IJ SOLN: 0.2500 mg | INTRAMUSCULAR | Status: DC | PRN

## 2011-05-03 MED ORDER — METHOCARBAMOL 500 MG PO TABS
500.0000 mg | ORAL_TABLET | Freq: Four times a day (QID) | ORAL | Status: DC
  Administered 2011-05-03 – 2011-05-05 (×8): 500 mg via ORAL
  Filled 2011-05-03 (×12): qty 1

## 2011-05-03 MED ORDER — OXYCODONE-ACETAMINOPHEN 5-325 MG PO TABS
1.0000 | ORAL_TABLET | ORAL | Status: DC | PRN
  Filled 2011-05-03 (×2): qty 2

## 2011-05-03 MED ORDER — PROPOFOL 10 MG/ML IV EMUL
INTRAVENOUS | Status: DC | PRN
  Administered 2011-05-03: 200 mg via INTRAVENOUS

## 2011-05-03 MED ORDER — HYDROCODONE-ACETAMINOPHEN 5-325 MG PO TABS
1.0000 | ORAL_TABLET | ORAL | Status: DC | PRN
  Administered 2011-05-04 – 2011-05-05 (×7): 1 via ORAL
  Administered 2011-05-05: 2 via ORAL
  Administered 2011-05-06: 1 via ORAL
  Filled 2011-05-03 (×5): qty 1
  Filled 2011-05-03 (×3): qty 2

## 2011-05-03 MED ORDER — ONDANSETRON HCL 4 MG/2ML IJ SOLN
INTRAMUSCULAR | Status: DC | PRN
  Administered 2011-05-03: 4 mg via INTRAVENOUS

## 2011-05-03 MED ORDER — SODIUM CHLORIDE 0.9 % IJ SOLN
3.0000 mL | Freq: Two times a day (BID) | INTRAMUSCULAR | Status: DC
  Administered 2011-05-05: 3 mL via INTRAVENOUS

## 2011-05-03 MED ORDER — ONDANSETRON HCL 4 MG/2ML IJ SOLN: 4.0000 mg | INTRAMUSCULAR | Status: DC | PRN

## 2011-05-03 MED ORDER — THROMBIN 20000 UNITS EX KIT
PACK | CUTANEOUS | Status: DC | PRN
  Administered 2011-05-03: 08:00:00 via TOPICAL

## 2011-05-03 MED ORDER — CYANOCOBALAMIN 250 MCG PO TABS
500.0000 ug | ORAL_TABLET | Freq: Every day | ORAL | Status: DC
  Administered 2011-05-03 – 2011-05-07 (×4): 500 ug via ORAL
  Filled 2011-05-03 (×6): qty 2

## 2011-05-03 MED ORDER — VECURONIUM BROMIDE 10 MG IV SOLR
INTRAVENOUS | Status: DC | PRN
  Administered 2011-05-03 (×2): 1 mg via INTRAVENOUS

## 2011-05-03 MED ORDER — CEFAZOLIN SODIUM 1-5 GM-% IV SOLN
1.0000 g | Freq: Three times a day (TID) | INTRAVENOUS | Status: AC
  Administered 2011-05-03 – 2011-05-04 (×2): 1 g via INTRAVENOUS
  Filled 2011-05-03 (×2): qty 50

## 2011-05-03 MED ORDER — 0.9 % SODIUM CHLORIDE (POUR BTL) OPTIME
TOPICAL | Status: DC | PRN
  Administered 2011-05-03: 1000 mL

## 2011-05-03 MED ORDER — MENTHOL 3 MG MT LOZG: 1.0000 | LOZENGE | OROMUCOSAL | Status: DC | PRN

## 2011-05-03 MED ORDER — MORPHINE SULFATE (PF) 1 MG/ML IV SOLN
INTRAVENOUS | Status: AC
  Filled 2011-05-03: qty 25

## 2011-05-03 MED ORDER — FENTANYL CITRATE 0.05 MG/ML IJ SOLN
INTRAMUSCULAR | Status: DC | PRN
  Administered 2011-05-03: 50 ug via INTRAVENOUS
  Administered 2011-05-03: 150 ug via INTRAVENOUS
  Administered 2011-05-03 (×3): 50 ug via INTRAVENOUS

## 2011-05-03 MED ORDER — BUPIVACAINE HCL (PF) 0.5 % IJ SOLN
INTRAMUSCULAR | Status: DC | PRN
  Administered 2011-05-03: 5 mL

## 2011-05-03 MED ORDER — ONDANSETRON HCL 4 MG/2ML IJ SOLN: 4.0000 mg | Freq: Once | INTRAMUSCULAR | Status: DC | PRN

## 2011-05-03 MED ORDER — LACTATED RINGERS IV SOLN
INTRAVENOUS | Status: DC | PRN
  Administered 2011-05-03 (×3): via INTRAVENOUS

## 2011-05-03 MED ORDER — SODIUM CHLORIDE 0.9 % IJ SOLN: 3.0000 mL | INTRAMUSCULAR | Status: DC | PRN

## 2011-05-03 MED ORDER — DIAZEPAM 5 MG PO TABS
5.0000 mg | ORAL_TABLET | Freq: Four times a day (QID) | ORAL | Status: DC | PRN
  Administered 2011-05-04 – 2011-05-07 (×6): 5 mg via ORAL
  Filled 2011-05-03 (×7): qty 1

## 2011-05-03 SURGICAL SUPPLY — 79 items
ADH SKN CLS APL DERMABOND .7 (GAUZE/BANDAGES/DRESSINGS) ×1
APL SKNCLS STERI-STRIP NONHPOA (GAUZE/BANDAGES/DRESSINGS) ×1
BAG DECANTER FOR FLEXI CONT (MISCELLANEOUS) ×2 IMPLANT
BENZOIN TINCTURE PRP APPL 2/3 (GAUZE/BANDAGES/DRESSINGS) ×1 IMPLANT
BIT DRILL NEURO 2X3.1 SFT TUCH (MISCELLANEOUS) IMPLANT
BLADE SURG ROTATE 9660 (MISCELLANEOUS) IMPLANT
BONE VOID FILLER STRIP 10CC (Bone Implant) ×1 IMPLANT
BUR MATCHSTICK NEURO 3.0 LAGG (BURR) ×3 IMPLANT
BUR PRECISION FLUTE 5.0 (BURR) ×2 IMPLANT
CANISTER SUCTION 2500CC (MISCELLANEOUS) ×2 IMPLANT
CLOTH BEACON ORANGE TIMEOUT ST (SAFETY) ×2 IMPLANT
CONT SPEC 4OZ CLIKSEAL STRL BL (MISCELLANEOUS) ×4 IMPLANT
COVER BACK TABLE 24X17X13 BIG (DRAPES) IMPLANT
COVER TABLE BACK 60X90 (DRAPES) ×2 IMPLANT
DERMABOND ADVANCED (GAUZE/BANDAGES/DRESSINGS) ×1
DERMABOND ADVANCED .7 DNX12 (GAUZE/BANDAGES/DRESSINGS) ×1 IMPLANT
DRAPE C-ARM 42X72 X-RAY (DRAPES) ×4 IMPLANT
DRAPE LAPAROTOMY 100X72X124 (DRAPES) ×2 IMPLANT
DRAPE POUCH INSTRU U-SHP 10X18 (DRAPES) ×2 IMPLANT
DRAPE SURG 17X23 STRL (DRAPES) ×2 IMPLANT
DRESSING TELFA 8X3 (GAUZE/BANDAGES/DRESSINGS) IMPLANT
DRILL NEURO 2X3.1 SOFT TOUCH (MISCELLANEOUS)
DURAPREP 26ML APPLICATOR (WOUND CARE) ×2 IMPLANT
ELECT REM PT RETURN 9FT ADLT (ELECTROSURGICAL) ×2
ELECTRODE REM PT RTRN 9FT ADLT (ELECTROSURGICAL) ×1 IMPLANT
GAUZE SPONGE 4X4 16PLY XRAY LF (GAUZE/BANDAGES/DRESSINGS) IMPLANT
GLOVE BIO SURGEON STRL SZ8 (GLOVE) ×4 IMPLANT
GLOVE BIOGEL PI IND STRL 7.5 (GLOVE) IMPLANT
GLOVE BIOGEL PI IND STRL 8 (GLOVE) ×2 IMPLANT
GLOVE BIOGEL PI IND STRL 8.5 (GLOVE) ×2 IMPLANT
GLOVE BIOGEL PI INDICATOR 7.5 (GLOVE) ×1
GLOVE BIOGEL PI INDICATOR 8 (GLOVE) ×2
GLOVE BIOGEL PI INDICATOR 8.5 (GLOVE) ×2
GLOVE ECLIPSE 8.0 STRL XLNG CF (GLOVE) ×4 IMPLANT
GLOVE ECLIPSE 8.5 STRL (GLOVE) ×2 IMPLANT
GLOVE EXAM NITRILE LRG STRL (GLOVE) IMPLANT
GLOVE EXAM NITRILE MD LF STRL (GLOVE) ×1 IMPLANT
GLOVE EXAM NITRILE XL STR (GLOVE) IMPLANT
GLOVE EXAM NITRILE XS STR PU (GLOVE) IMPLANT
GLOVE SURG SS PI 6.5 STRL IVOR (GLOVE) ×4 IMPLANT
GOWN BRE IMP SLV AUR LG STRL (GOWN DISPOSABLE) IMPLANT
GOWN BRE IMP SLV AUR XL STRL (GOWN DISPOSABLE) ×6 IMPLANT
GOWN STRL REIN 2XL LVL4 (GOWN DISPOSABLE) ×4 IMPLANT
KIT BASIN OR (CUSTOM PROCEDURE TRAY) ×2 IMPLANT
KIT INFUSE SMALL (Orthopedic Implant) ×1 IMPLANT
KIT POSITION SURG JACKSON T1 (MISCELLANEOUS) ×2 IMPLANT
KIT ROOM TURNOVER OR (KITS) ×2 IMPLANT
MILL MEDIUM DISP (BLADE) ×2 IMPLANT
NDL HYPO 25X1 1.5 SAFETY (NEEDLE) ×1 IMPLANT
NDL SPNL 18GX3.5 QUINCKE PK (NEEDLE) IMPLANT
NEEDLE HYPO 25X1 1.5 SAFETY (NEEDLE) ×2 IMPLANT
NEEDLE SPNL 18GX3.5 QUINCKE PK (NEEDLE) ×2 IMPLANT
NS IRRIG 1000ML POUR BTL (IV SOLUTION) ×2 IMPLANT
PACK LAMINECTOMY NEURO (CUSTOM PROCEDURE TRAY) ×2 IMPLANT
PAD ARMBOARD 7.5X6 YLW CONV (MISCELLANEOUS) ×6 IMPLANT
PATTIES SURGICAL .5 X.5 (GAUZE/BANDAGES/DRESSINGS) IMPLANT
PATTIES SURGICAL .5 X1 (DISPOSABLE) IMPLANT
PATTIES SURGICAL 1X1 (DISPOSABLE) IMPLANT
PEEK PLIF NOVEL 9X25X12 (Peek) ×2 IMPLANT
ROD 35MM (Rod) ×4 IMPLANT
SCREW POLYAX 6.5X45MM (Screw) ×4 IMPLANT
SCREW SET SPINAL STD HEXALOBE (Screw) ×8 IMPLANT
SPONGE GAUZE 4X4 12PLY (GAUZE/BANDAGES/DRESSINGS) ×2 IMPLANT
SPONGE LAP 4X18 X RAY DECT (DISPOSABLE) IMPLANT
SPONGE SURGIFOAM ABS GEL 100 (HEMOSTASIS) IMPLANT
STAPLER SKIN PROX WIDE 3.9 (STAPLE) IMPLANT
STRIP CLOSURE SKIN 1/2X4 (GAUZE/BANDAGES/DRESSINGS) ×2 IMPLANT
SUT VIC AB 1 CT1 18XBRD ANBCTR (SUTURE) ×2 IMPLANT
SUT VIC AB 1 CT1 8-18 (SUTURE) ×4
SUT VIC AB 2-0 CT1 18 (SUTURE) ×4 IMPLANT
SUT VIC AB 3-0 SH 8-18 (SUTURE) ×4 IMPLANT
SYR 20ML ECCENTRIC (SYRINGE) ×2 IMPLANT
SYR 3ML LL SCALE MARK (SYRINGE) ×8 IMPLANT
SYR 5ML LL (SYRINGE) IMPLANT
TOWEL OR 17X24 6PK STRL BLUE (TOWEL DISPOSABLE) ×2 IMPLANT
TOWEL OR 17X26 10 PK STRL BLUE (TOWEL DISPOSABLE) ×2 IMPLANT
TRAP SPECIMEN MUCOUS 40CC (MISCELLANEOUS) ×2 IMPLANT
TRAY FOLEY CATH 14FRSI W/METER (CATHETERS) ×2 IMPLANT
WATER STERILE IRR 1000ML POUR (IV SOLUTION) ×2 IMPLANT

## 2011-05-03 NOTE — Transfer of Care (Signed)
Immediate Anesthesia Transfer of Care Note  Patient: [name]  Procedure(s) Performed: Procedure(s) (LRB): POSTERIOR LUMBAR FUSION 1 LEVEL (Bilateral)  Patient Location: PACU  Anesthesia Type: General  Level of Consciousness: awake  Airway & Oxygen Therapy: Patient Spontanous Breathing and Patient connected to face mask oxygen  Post-op Assessment: Report given to PACU RN  Post vital signs: Reviewed and stable  Complications: No apparent anesthesia complications

## 2011-05-03 NOTE — Anesthesia Preprocedure Evaluation (Addendum)
Anesthesia Evaluation  Patient identified by MRN, date of birth, ID band Patient awake    Reviewed: Allergy & Precautions, H&P , NPO status , Patient's Chart, lab work & pertinent test results  Airway Mallampati: I TM Distance: >3 FB Neck ROM: Full    Dental  (+) Teeth Intact and Dental Advisory Given   Pulmonary  breath sounds clear to auscultation        Cardiovascular Rhythm:Regular Rate:Normal     Neuro/Psych    GI/Hepatic GERD-  Controlled,  Endo/Other    Renal/GU      Musculoskeletal   Abdominal   Peds  Hematology   Anesthesia Other Findings   Reproductive/Obstetrics                          Anesthesia Physical Anesthesia Plan  ASA: II  Anesthesia Plan: General   Post-op Pain Management:    Induction: Intravenous  Airway Management Planned: Oral ETT  Additional Equipment:   Intra-op Plan:   Post-operative Plan: Extubation in OR  Informed Consent: I have reviewed the patients History and Physical, chart, labs and discussed the procedure including the risks, benefits and alternatives for the proposed anesthesia with the patient or authorized representative who has indicated his/her understanding and acceptance.   Dental advisory given  Plan Discussed with: CRNA, Anesthesiologist and Surgeon  Anesthesia Plan Comments:         Anesthesia Quick Evaluation

## 2011-05-03 NOTE — Preoperative (Signed)
Beta Blockers   Reason not to administer Beta Blockers:Not Applicable 

## 2011-05-03 NOTE — Interval H&P Note (Signed)
History and Physical Interval Note:  05/03/2011 7:38 AM  [name]  has presented today for surgery, with the diagnosis of spondylolisthesis lumbar degenerative disc disease lumbar radiculopathy  The various methods of treatment have been discussed with the patient and family. After consideration of risks, benefits and other options for treatment, the patient has consented to  Procedure(s) (LRB): POSTERIOR LUMBAR FUSION 1 LEVEL (N/A) as a surgical intervention .  The patients' history has been reviewed, patient examined, no change in status, stable for surgery.  I have reviewed the patients' chart and labs.  Questions were answered to the patient's satisfaction.     Adrien Dietzman D  Date of Initial H&P: 05/02/2011  History reviewed, patient examined, no change in status, stable for surgery.

## 2011-05-03 NOTE — Op Note (Signed)
05/03/2011  10:54 AM  PATIENT:  [name]  46 y.o. female  PRE-OPERATIVE DIAGNOSIS:  Lumbar four-five spondylolisthesis, synovial cyst, lumbar stenosis, lumbar degenerative disc disease, lumbar radiculopathy  POST-OPERATIVE DIAGNOSIS: Lumbar four-five spondylolisthesis, synovial cyst, lumbar stenosis, lumbar degenerative disc disease, lumbar radiculopathy   PROCEDURE:  Procedure(s) (LRB): POSTERIOR LUMBAR Decompression (in excess of required for interbody fusion) with resection of synovial cyst, PLIF with PEEK cages, BMP, autograft, Nonsegmental pedicle screw instrumentation L 4-5 with posterolateral arthrodesis L4-5 (Bilateral)  SURGEON:  Surgeon(s) and Role:    * Maeola Harman, MD - Primary    * Temple Pacini, MD - Assisting  PHYSICIAN ASSISTANT:   ASSISTANTS: [place], RN   ANESTHESIA:   general  EBL:  Total I/O In: 2100 [I.V.:2100] Out: 950 [Urine:700; Blood:250]  BLOOD ADMINISTERED:none  DRAINS: none   LOCAL MEDICATIONS USED:  LIDOCAINE   SPECIMEN:  No Specimen  DISPOSITION OF SPECIMEN:  N/A  COUNTS:  YES  TOURNIQUET:  * No tourniquets in log *  DICTATION: Patient is 46 year old woman with mobile spondylolisthesis of L4 on L5 with synovial cyst off the left L4/5 facet joint. She has bilateral L5 radiculopathies.It was elected to take her to surgery for decompression and fusion at this level.   Procedure: Patient was placed in a prone position on the [place] table after smooth and uncomplicated induction of general endotracheal anesthesia. Her low back was prepped and draped in usual sterile fashion with DuraPrep. Area of incision was infiltrated with local lidocaine. Incision was made to the lumbodorsal fascia was incised and exposure was performed of the L4-L5 transverse processes, laminae and facets. Intraoperative x-ray was obtained which confirmed correct orientation. Bilateral laminectomies of L5 was performed with disarticulation of the facet joints at this  level and thorough decompression was performed of both L4 and L5 nerve roots along with the common dural tube. This included decompression of both L4 and L5 nerve roots, including resection of left sided facetal synovial cyst, in excess of that required for PLIF. A thorough discectomy was initially performed on the left with preparation of the endplates for grafting a trial spacer was placed this level and a thorough discectomy was performed on the right as well. Bone autograft was packed within the interspace bilaterally along with small BMP kit and NexOss bone graft extender. Bilateral median 12 mm peek cages were packed with BMP and extender and was inserted the interspace and countersunk appropriately. The posterolateral region was extensively decorticated and pedicle probes were placed at L4 and L5 bilaterally. Intraoperative fluoroscopy confirmed correct orientationin the AP and lateral plane. 45 x 6.5 mm pedicle screws were placed at L5 bilaterally and 45 x 6.5 mm screws placed at L4 bilaterally final x-rays demonstrated well-positioned interbody grafts and pedicle screw fixation. A 35 mm lordotic rod was placed on the right and a 35 mm rod was placed on the left locked down in situ and the posterolateral region was packed with the remaining BMP and bone autograft on the right with BMP and NexOss on the left. Wound was irrigated. Fascia was closed with 1 Vicryl sutures skin edges were reapproximated 2 and 3-0 Vicryl sutures. The wound is dressed with benzoin Steri-Strips Telfa gauze and tape the patient was extubated in the operating room and taken to recovery in stable satisfactory condition she tolerated the operation well. Counts were correct at the end of the case.  PLAN OF CARE: Admit to inpatient   PATIENT DISPOSITION:  PACU - hemodynamically stable.  Delay start of Pharmacological VTE agent (>24hrs) due to surgical blood loss or risk of bleeding: yes

## 2011-05-03 NOTE — Anesthesia Postprocedure Evaluation (Signed)
  Anesthesia Post-op Note  Patient: [name]  Procedure(s) Performed: Procedure(s) (LRB): POSTERIOR LUMBAR FUSION 1 LEVEL (Bilateral)  Patient Location: PACU  Anesthesia Type: General  Level of Consciousness: awake, alert  and oriented  Airway and Oxygen Therapy: Patient Spontanous Breathing and Patient connected to nasal cannula oxygen  Post-op Pain: mild  Post-op Assessment: Post-op Vital signs reviewed, Patient's Cardiovascular Status Stable, Respiratory Function Stable, Patent Airway, No signs of Nausea or vomiting and Pain level controlled  Post-op Vital Signs: Reviewed and stable  Complications: No apparent anesthesia complications

## 2011-05-04 MED ORDER — MORPHINE SULFATE (PF) 1 MG/ML IV SOLN
INTRAVENOUS | Status: AC
  Filled 2011-05-04: qty 25

## 2011-05-04 NOTE — Evaluation (Signed)
Occupational Therapy Evaluation Patient Details Name: [name] MRN: [id number] DOB: [birth year] Today's Date: 05/04/2011  Problem List:  Patient Active Problem List  Diagnoses  . OBESITY, NOS  . ANEMIA, ACUTE BLOOD LOSS  . REFLUX ESOPHAGITIS    Past Medical History:  Past Medical History  Diagnosis Date  . Anemia   . Carpal tunnel syndrome on left   . Chronic lower back pain    Past Surgical History:  Past Surgical History  Procedure Date  . Posterior fusion lumbar spine 05/03/11    "& put in 3 screws"  . Shoulder arthroscopy w/ rotator cuff repair 11/2008    left  . Shoulder open rotator cuff repair 02/2010    right  . Anterior cervical decomp/discectomy fusion 07/2008    C4-5; C5-6  . Vaginal hysterectomy 10/2008    w/partial right salpingectomy  . Tubal ligation 1980's  . Carpal tunnel release 2010    left    OT Assessment/Plan/Recommendation OT Assessment Clinical Impression Statement: 46 yo female s/p PLIF that could benefit from skilled OT acutely. Pt currently progressing well with and should be at adequate level for d/c home after AE education. OT Recommendation/Assessment: Patient will need skilled OT in the acute care venue OT Problem List: Decreased strength;Decreased activity tolerance;Impaired balance (sitting and/or standing);Decreased knowledge of use of DME or AE;Decreased safety awareness;Pain OT Therapy Diagnosis : Generalized weakness;Acute pain OT Plan OT Frequency: Min 2X/week OT Treatment/Interventions: Self-care/ADL training;DME and/or AE instruction;Therapeutic activities;Patient/family education;Balance training OT Recommendation Follow Up Recommendations: No OT follow up Equipment Recommended: Rolling walker with 5" wheels;3 in 1 bedside comode Individuals Consulted Consulted and Agree with Results and Recommendations: Patient OT Goals Acute Rehab OT Goals OT Goal Formulation: With patient Time For Goal Achievement: 7 days ADL  Goals Pt Will Perform Lower Body Bathing: with modified independence;Sit to stand from chair;Sit to stand from bed;with adaptive equipment ADL Goal: Lower Body Bathing - Progress: Goal set today Pt Will Perform Upper Body Dressing: with modified independence;Sitting, chair;Supported (don brace ) ADL Goal: Upper Body Dressing - Progress: Goal set today Pt Will Perform Lower Body Dressing: with modified independence;Sit to stand from bed;Sit to stand from chair;with adaptive equipment ADL Goal: Lower Body Dressing - Progress: Goal set today Pt Will Transfer to Toilet: with modified independence;3-in-1 ADL Goal: Toilet Transfer - Progress: Goal set today Pt Will Perform Toileting - Clothing Manipulation: with modified independence;Sitting on 3-in-1 or toilet ADL Goal: Toileting - Clothing Manipulation - Progress: Goal set today Pt Will Perform Toileting - Hygiene: with modified independence;Sit to stand from 3-in-1/toilet ADL Goal: Toileting - Hygiene - Progress: Goal set today Miscellaneous OT Goals Miscellaneous OT Goal #1: Pt will complete bed mobility mod I with HOB 20 degrees or less with no bed rails as a precursor to ADLS OT Goal: Miscellaneous Goal #1 - Progress: Goal set today  OT Evaluation Precautions/Restrictions  Precautions Precautions: Back Precaution Booklet Issued: Yes (comment) Precaution Comments: provided handout- pt recalled 2 out 3 back precautions (missed arching precaution) Required Braces or Orthoses: Yes Spinal Brace: Lumbar corset;Applied in sitting position Restrictions Weight Bearing Restrictions: No Prior Functioning Home Living Lives With: Other (Comment) (roommate) Receives Help From: Friend(s) Type of Home: House Home Layout: Two level;1/2 bath on main level Alternate Level Stairs-Rails: Left Alternate Level Stairs-Number of Steps: 8 Home Access: Stairs to enter Entrance Stairs-Rails: Left Entrance Stairs-Number of Steps: 4 Bathroom Shower/Tub:  Forensic scientist: Standard Bathroom Accessibility: Yes How Accessible: Accessible via walker Home  Adaptive Equipment: None Prior Function Level of Independence: Independent with basic ADLs;Independent with gait;Independent with transfers Able to Take Stairs?: Yes Driving: Yes Vocation: Workers comp ADL ADL Eating/Feeding: Performed;Modified independent Where Assessed - Eating/Feeding: Chair Grooming: Performed;Wash/dry face;Teeth care (min guard (A)) Where Assessed - Grooming: Standing at sink Upper Body Dressing: Performed;Minimal assistance Where Assessed - Upper Body Dressing: Sitting, bed;Unsupported Lower Body Dressing: Performed;Maximal assistance (unable to cross BIL LEs) Lower Body Dressing Details (indicate cue type and reason): Pt will need AE education  Where Assessed - Lower Body Dressing: Sitting, chair;Supported Sports coach;Moderate assistance Toilet Transfer Method: Proofreader: Raised toilet seat with arms (or 3-in-1 over toilet) Toileting - Clothing Manipulation: Simulated;Moderate assistance Where Assessed - Toileting Clothing Manipulation: Sit to stand from 3-in-1 or toilet Toileting - Hygiene: Simulated;Moderate assistance Where Assessed - Toileting Hygiene: Sit to stand from 3-in-1 or toilet Equipment Used: Rolling walker Ambulation Related to ADLs: pt ambulating with Min (A) to sink level Vision/Perception  Vision - History Baseline Vision: No visual deficits Vision - Assessment Eye Alignment: Within Functional Limits Cognition Cognition Arousal/Alertness: Awake/alert Overall Cognitive Status: Appears within functional limits for tasks assessed Orientation Level: Oriented X4 Sensation/Coordination Sensation Light Touch: Appears Intact Coordination Gross Motor Movements are Fluid and Coordinated: Yes Fine Motor Movements are Fluid and Coordinated: Yes Extremity Assessment RUE  Assessment RUE Assessment: Within Functional Limits (grossly) LUE Assessment LUE Assessment: Within Functional Limits (grossly) Mobility  Bed Mobility Bed Mobility: Yes Rolling Right: 3: Mod assist;With rail Rolling Right Details (indicate cue type and reason): (A) to initiate roll and complete roll with max cues for technique to prevent twisting. Rolling Left: 4: Min assist;With rail Rolling Left Details (indicate cue type and reason): Tactile input for log roll sequence and mod v/c for safety with back precautions Left Sidelying to Sit: 4: Min assist;With rails;HOB flat Sitting - Scoot to Edge of Bed: 3: Mod assist;With rail Sitting - Scoot to [place] of Bed Details (indicate cue type and reason): mod v/c on sequence of weight shift to scoot to EOBq Sit to Sidelying Left: 3: Mod assist;HOB elevated (comment degrees);With rail (HOB 15 degrees) Sit to Sidelying Left Details (indicate cue type and reason): (A) with LE elevated onto bed with cues for technique for proper body position to prevent twisting. Transfers Transfers: Yes Sit to Stand: 3: Mod assist;With armrests;With upper extremity assist;From chair/3-in-1 Sit to Stand Details (indicate cue type and reason): Pt required extended time  Stand to Sit: 3: Mod assist;With upper extremity assist;To chair/3-in-1 (pillows placed in chair) Stand to Sit Details: Pt requested bed to be elevated and needed (A) to slowly descend to bed Exercises   End of Session OT - End of Session Equipment Utilized During Treatment: Gait belt;Back brace Activity Tolerance: Patient tolerated treatment well Patient left: in chair;with call bell in reach Nurse Communication: Mobility status for transfers;Mobility status for ambulation General Behavior During Session: [facility] for tasks performed Cognition: [facility] for tasks performed   Lucile Shutters 05/04/2011, 12:53 PM  Pager: [phone]

## 2011-05-04 NOTE — Progress Notes (Signed)
Patient ID: [name], female   DOB: Jul 06, 1965, 46 y.o.   MRN: [id number] C/o incisional pain and some numbness in legs. To decrease morphine strenght secondary to decrease of 0xygen saturation

## 2011-05-04 NOTE — Evaluation (Signed)
Physical Therapy Evaluation Patient Details Name: [name] MRN: [id number] DOB: 19-Mar-1965 Today's Date: 05/04/2011  Problem List:  Patient Active Problem List  Diagnoses  . OBESITY, NOS  . ANEMIA, ACUTE BLOOD LOSS  . REFLUX ESOPHAGITIS    Past Medical History:  Past Medical History  Diagnosis Date  . Anemia   . Carpal tunnel syndrome on left   . Chronic lower back pain    Past Surgical History:  Past Surgical History  Procedure Date  . Posterior fusion lumbar spine 05/03/11    "& put in 3 screws"  . Shoulder arthroscopy w/ rotator cuff repair 11/2008    left  . Shoulder open rotator cuff repair 02/2010    right  . Anterior cervical decomp/discectomy fusion 07/2008    C4-5; C5-6  . Vaginal hysterectomy 10/2008    w/partial right salpingectomy  . Tubal ligation 1980's  . Carpal tunnel release 2010    left    PT Assessment/Plan/Recommendation PT Assessment Clinical Impression Statement: Pt is 46 y/o female admitted for s/p PLIF.  Pt will benefit from acute PT services to improve overall mobility and decrease pain to prepare for safe d/c home. PT Recommendation/Assessment: Patient will need skilled PT in the acute care venue PT Problem List: Decreased activity tolerance;Decreased strength;Decreased balance;Decreased mobility;Decreased knowledge of use of DME;Decreased safety awareness;Decreased knowledge of precautions;Pain PT Therapy Diagnosis : Difficulty walking;Abnormality of gait;Generalized weakness;Acute pain PT Plan PT Frequency: Min 5X/week PT Treatment/Interventions: DME instruction;Gait training;Stair training;Functional mobility training;Therapeutic activities;Therapeutic exercise;Balance training;Patient/family education PT Recommendation Follow Up Recommendations: Home health PT;Supervision/Assistance - 24 hour Equipment Recommended: Rolling walker with 5" wheels;3 in 1 bedside comode PT Goals  Acute Rehab PT Goals PT Goal Formulation: With  patient Time For Goal Achievement: 7 days Pt will Roll Supine to Left Side: with modified independence PT Goal: Rolling Supine to Left Side - Progress: Goal set today Pt will go Supine/Side to Sit: with modified independence PT Goal: Supine/Side to Sit - Progress: Goal set today Pt will go Sit to Supine/Side: with modified independence PT Goal: Sit to Supine/Side - Progress: Goal set today Pt will go Sit to Stand: with modified independence PT Goal: Sit to Stand - Progress: Goal set today Pt will go Stand to Sit: with modified independence PT Goal: Stand to Sit - Progress: Goal set today Pt will Ambulate: >150 feet;with modified independence;with least restrictive assistive device PT Goal: Ambulate - Progress: Goal set today Pt will Go Up / Down Stairs: 6-9 stairs;with modified independence;with least restrictive assistive device PT Goal: Up/Down Stairs - Progress: Goal set today Additional Goals Additional Goal #1: Pt will be able to recall and adhere to 3/3 back precautions.  PT Evaluation Precautions/Restrictions  Precautions Precautions: Back Precaution Booklet Issued: No Precaution Comments: Reviewed 3/3 back precuations Required Braces or Orthoses: Yes Spinal Brace: Lumbar corset;Applied in sitting position Restrictions Weight Bearing Restrictions: No Prior Functioning  Home Living Lives With: Other (Comment) (roommate) Receives Help From: Friend(s) Type of Home: House Home Layout: Two level;1/2 bath on main level Alternate Level Stairs-Rails: Left Alternate Level Stairs-Number of Steps: 8 Home Access: Stairs to enter Entrance Stairs-Rails: Left Entrance Stairs-Number of Steps: 4 Bathroom Shower/Tub: Forensic scientist: Standard Bathroom Accessibility: Yes How Accessible: Accessible via walker Home Adaptive Equipment: None Prior Function Level of Independence: Independent with basic ADLs;Independent with gait;Independent with transfers Able to  Take Stairs?: Yes Driving: Yes 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 Arousal/Alertness: Awake/alert Orientation Level: Oriented X4 Sensation/Coordination Sensation Light Touch: Appears  Intact Coordination Gross Motor Movements are Fluid and Coordinated: Yes Extremity Assessment RUE Assessment RUE Assessment: Not tested LUE Assessment LUE Assessment: Not tested RLE Assessment RLE Assessment: Not tested LLE Assessment LLE Assessment: Not tested Mobility (including Balance) Bed Mobility Bed Mobility: Yes Rolling Right: 3: Mod assist;With rail Rolling Right Details (indicate cue type and reason): (A) to initiate roll and complete roll with max cues for technique to prevent twisting. Sit to Sidelying Left: 3: Mod assist;HOB elevated (comment degrees);With rail (HOB 15 degrees) Sit to Sidelying Left Details (indicate cue type and reason): (A) with LE elevated onto bed with cues for technique for proper body position to prevent twisting. Transfers Transfers: Yes Sit to Stand: 3: Mod assist;From chair/3-in-1;With armrests Sit to Stand Details (indicate cue type and reason): Pt needed extra time to scoot hips to edge of chair to attempt to stand.  (A) to initiate transfer with max cues for UE placement. Stand to Sit: 4: Min assist;To bed;To elevated surface Stand to Sit Details: Pt requested bed to be elevated and needed (A) to slowly descend to bed Ambulation/Gait Ambulation/Gait: Yes Ambulation/Gait Assistance: 4: Min assist Ambulation/Gait Assistance Details (indicate cue type and reason): (A) to maintain balance and manage RW with cues for correct body position within RW. Ambulation Distance (Feet): 5 Feet Assistive device: Rolling walker Gait Pattern: Step-to pattern;Trunk flexed Gait velocity: decreased due to pain Stairs: No Wheelchair Mobility Wheelchair Mobility: No  Posture/Postural Control Posture/Postural Control: No significant limitations Balance Balance  Assessed: Yes Static Sitting Balance Static Sitting - Balance Support: Feet supported;Bilateral upper extremity supported Static Sitting - Level of Assistance: 5: Stand by assistance Static Sitting - Comment/# of Minutes: Pt with tense posture in sitting and needs cues to relax Exercise    End of Session PT - End of Session Equipment Utilized During Treatment: Gait belt;Back brace Activity Tolerance: Patient limited by pain Patient left: in bed;with call bell in reach;with bed alarm set Nurse Communication: Mobility status for transfers;Mobility status for ambulation General Behavior During Session: Lethargic Cognition: WFL for tasks performed  Gibson Lad 05/04/2011, 10:00 AM Jake Shark, PT DPT [phone]

## 2011-05-05 MED ORDER — ACETAMINOPHEN 650 MG RE SUPP: 650.0000 mg | RECTAL | Status: DC | PRN

## 2011-05-05 MED ORDER — METHOCARBAMOL 500 MG PO TABS
500.0000 mg | ORAL_TABLET | Freq: Four times a day (QID) | ORAL | Status: DC | PRN
  Administered 2011-05-06: 500 mg via ORAL

## 2011-05-05 MED ORDER — ACETAMINOPHEN 325 MG PO TABS: 650.0000 mg | ORAL_TABLET | ORAL | Status: DC | PRN

## 2011-05-05 NOTE — Progress Notes (Signed)
OT Cancellation Note  Treatment cancelled today due to medical issues with patient which prohibited therapy - pt with increased pain, and feeling "badly"  Will re-attempt 05/06/11.  Boykin Reaper [phone] 05/05/2011, 4:37 PM

## 2011-05-05 NOTE — Progress Notes (Signed)
Filed Vitals:   05/05/11 0600 05/05/11 0654 05/05/11 0933 05/05/11 1042  BP: 95/61 116/80 103/68 118/84  Pulse: 78  116 103  Temp: 98.3 F (36.8 C)     TempSrc: Oral     Resp: 18     Height:      Weight:      SpO2: 97%      Patient resting in bed, having moderate pain, but nursing staff has been limited in the amount of pain medication he can give because of relative low blood pressure. They feel that it did to the scheduled Robaxin and we will therefore change it when necessary, which should allow them to give more pain medication for the patient. Because Dr. Venetia Maxon does not like the use of NSAIDS in fusion patients, we will not order Toradol.  Dressing is dry and intact.  Plan: Adjustment to pain management regimen as described above.  Hewitt Shorts, MD 05/05/2011, 11:15 AM

## 2011-05-05 NOTE — Progress Notes (Signed)
Physical Therapy Treatment Patient Details Name: [name] MRN: [id number] DOB: 1965/09/06 Today's Date: 05/05/2011  PT Assessment/Plan  PT - Assessment/Plan Comments on Treatment Session: Pt progressing well this session, ambulation distance increased. Pt still limited by pain.  PT Plan: Discharge plan remains appropriate;Frequency remains appropriate PT Frequency: Min 5X/week Follow Up Recommendations: Home health PT;Supervision/Assistance - 24 hour Equipment Recommended: Rolling walker with 5" wheels;3 in 1 bedside comode PT Goals  Acute Rehab PT Goals PT Goal Formulation: With patient PT Goal: Sit to Stand - Progress: Progressing toward goal PT Goal: Stand to Sit - Progress: Progressing toward goal PT Goal: Ambulate - Progress: Progressing toward goal Additional Goals PT Goal: Additional Goal #1 - Progress: Progressing toward goal  PT Treatment Precautions/Restrictions  Precautions Precautions: Back Precaution Booklet Issued: Yes (comment) Precaution Comments: Pt able to recall 3/3 back precautions Required Braces or Orthoses: Yes Spinal Brace: Lumbar corset;Applied in sitting position Restrictions Weight Bearing Restrictions: No Mobility (including Balance) Transfers Transfers: Yes Sit to Stand: 4: Min assist;With upper extremity assist;From bed;From toilet Sit to Stand Details (indicate cue type and reason): VC for hand placement and sequencing. Min assist for initiation and forward translation.  Stand to Sit: 4: Min assist;With upper extremity assist;To bed;To toilet Stand to Sit Details: VC for hand placement. Pt able to control descent Ambulation/Gait Ambulation/Gait: Yes Ambulation/Gait Assistance: 4: Min assist (Minguard assist) Ambulation/Gait Assistance Details (indicate cue type and reason): VC for distance to RW for safety as well as postural cues. Pt able to maintain back precautions throughout Ambulation Distance (Feet): 100 Feet Assistive device:  Rolling walker Gait Pattern: Step-to pattern;Trunk flexed Gait velocity: Slow cadence Stairs: No    Exercise    End of Session PT - End of Session Equipment Utilized During Treatment: Gait belt;Back brace Activity Tolerance: Patient tolerated treatment well Patient left: in bed;with call bell in reach;with bed alarm set Nurse Communication: Mobility status for transfers;Mobility status for ambulation General Behavior During Session: [facility] for tasks performed Cognition: [facility] for tasks performed  Milana Kidney 05/05/2011, 1:31 PM  05/05/2011 Milana Kidney DPT PAGER: [phone] OFFICE: [phone]

## 2011-05-06 LAB — CBC
HCT: 34.6 % — ABNORMAL LOW (ref 36.0–46.0)
Hemoglobin: 11.8 g/dL — ABNORMAL LOW (ref 12.0–15.0)
MCH: 31.1 pg (ref 26.0–34.0)
MCV: 91.1 fL (ref 78.0–100.0)
Platelets: 297 10*3/uL (ref 150–400)
RBC: 3.8 MIL/uL — ABNORMAL LOW (ref 3.87–5.11)
WBC: 13.2 10*3/uL — ABNORMAL HIGH (ref 4.0–10.5)

## 2011-05-06 MED ORDER — SODIUM CHLORIDE 0.9 % IV BOLUS (SEPSIS): 500.0000 mL | Freq: Once | INTRAVENOUS | Status: DC

## 2011-05-06 MED ORDER — HYDROMORPHONE HCL 2 MG PO TABS
2.0000 mg | ORAL_TABLET | ORAL | Status: DC | PRN
  Administered 2011-05-06 – 2011-05-07 (×7): 2 mg via ORAL
  Filled 2011-05-06 (×7): qty 1

## 2011-05-06 MED ORDER — BISACODYL 10 MG RE SUPP
10.0000 mg | Freq: Every day | RECTAL | Status: DC | PRN
  Administered 2011-05-06: 10 mg via RECTAL
  Filled 2011-05-06: qty 1

## 2011-05-06 NOTE — Progress Notes (Signed)
CARE MANAGEMENT NOTE 05/06/2011      Action/Plan:   Discharge planning. Left message for Lavella Lemons- RN Case Manager for [facility] [phone], regarding [facility] and DME. will follow.   Anticipated DC Date:     Anticipated DC Plan:  HOME W HOME HEALTH SERVICES         Choice offered to / List presented to:             Status of service:  In process, will continue to follow

## 2011-05-06 NOTE — Progress Notes (Signed)
Occupational Therapy Treatment Patient Details Name: [name] MRN: [id number] DOB: 01-Jul-1965 Today's Date: 05/06/2011  OT Assessment/Plan OT Assessment/Plan Comments on Treatment Session: Pt continues to require (A) with sit<>stand however otherwise pt progressing well.  OT Plan: Discharge plan remains appropriate OT Frequency: Min 2X/week Follow Up Recommendations: No OT follow up Equipment Recommended: Rolling walker with 5" wheels;3 in 1 bedside comode OT Goals Acute Rehab OT Goals OT Goal Formulation: With patient Time For Goal Achievement: 7 days ADL Goals Pt Will Perform Lower Body Dressing: with modified independence;Sit to stand from bed;Sit to stand from chair;with adaptive equipment ADL Goal: Lower Body Dressing - Progress: Progressing toward goals Pt Will Transfer to Toilet: with modified independence;3-in-1 ADL Goal: Toilet Transfer - Progress: Progressing toward goals Pt Will Perform Toileting - Clothing Manipulation: with modified independence;Sitting on 3-in-1 or toilet ADL Goal: Toileting - Clothing Manipulation - Progress: Progressing toward goals Pt Will Perform Toileting - Hygiene: with modified independence;Sit to stand from 3-in-1/toilet ADL Goal: Toileting - Hygiene - Progress: Progressing toward goals Miscellaneous OT Goals Miscellaneous OT Goal #1: Pt will complete bed mobility mod I with HOB 20 degrees or less with no bed rails as a precursor to ADLS OT Goal: Miscellaneous Goal #1 - Progress: Progressing toward goals  OT Treatment Precautions/Restrictions  Precautions Precautions: Back Precaution Booklet Issued: Yes (comment) Precaution Comments: Pt able to recall 3/3 back precautions Required Braces or Orthoses: Yes Spinal Brace: Lumbar corset;Applied in sitting position Restrictions Weight Bearing Restrictions: No   ADL ADL Grooming: Performed;Wash/dry hands;Set up Where Assessed - Grooming: Sitting, chair;Supported Statistician:  IT sales professional Method: Proofreader: Raised toilet seat with arms (or 3-in-1 over toilet) Toileting - Clothing Manipulation: Performed;Minimal assistance Where Assessed - Toileting Clothing Manipulation: Sit to stand from 3-in-1 or toilet Toileting - Hygiene: Performed;Minimal assistance Where Assessed - Toileting Hygiene: Sit to stand from 3-in-1 or toilet ADL Comments: pt with urgency to void bowels on arrival. Pt min (A) to don brace due to urgency. pt 3 out 3 back precautions verbalized. Pt able to cross BIL LE. Pt edcuated on using reacher.  Mobility  Bed Mobility Bed Mobility: Yes Rolling Left: 5: Supervision;With rail Sitting - Scoot to Edge of Bed: 5: Supervision Sit to Sidelying Left: 5: Supervision;With rail Transfers Transfers: Yes Sit to Stand: 3: Mod assist;With upper extremity assist;From bed Sit to Stand Details (indicate cue type and reason): pt placing one hand in the middle of RW and pushing up from bed with second hand.  Stand to Sit: 4: Min assist;With upper extremity assist;With armrests;To chair/3-in-1 Exercises    End of Session OT - End of Session Equipment Utilized During Treatment: Gait belt;Back brace Activity Tolerance: Patient tolerated treatment well Patient left: in chair;with call bell in reach Nurse Communication: Mobility status for transfers;Mobility status for ambulation General Behavior During Session: [facility] for tasks performed Cognition: [facility] for tasks performed  Lucile Shutters  05/06/2011, 12:37 PM Pager: [phone]

## 2011-05-06 NOTE — Progress Notes (Signed)
Agree with student PT treatment note. Dalaysia Harms, PT DPT [phone]  

## 2011-05-06 NOTE — Progress Notes (Signed)
Physical Therapy Treatment Patient Details Name: [name] MRN: [id number] DOB: 1966-02-15 Today's Date: 05/06/2011  PT Assessment/Plan  PT - Assessment/Plan Comments on Treatment Session: Patient ambulated 100 feet with increased safety awareness.  Patient has been having low blood pressure with dizziness.  Patient's blood pressure pre-treatment was 106/70 and post-treatment was 110/67.  Patient reported mild dizziness approxmiately 75 feet into walking.  Patient continues to struggle most with sit to stand transfers and stand to sit transfers. PT Plan: Discharge plan remains appropriate;Frequency remains appropriate PT Frequency: Min 5X/week Follow Up Recommendations: Home health PT;Supervision/Assistance - 24 hour Equipment Recommended: Rolling walker with 5" wheels;3 in 1 bedside comode PT Goals  Acute Rehab PT Goals PT Goal Formulation: With patient Time For Goal Achievement: 7 days Pt will go Sit to Stand: with modified independence PT Goal: Sit to Stand - Progress: Progressing toward goal Pt will go Stand to Sit: with modified independence PT Goal: Stand to Sit - Progress: Progressing toward goal Pt will Ambulate: >150 feet;with modified independence;with least restrictive assistive device PT Goal: Ambulate - Progress: Progressing toward goal Additional Goals Additional Goal #1: Pt will be able to recall and adhere to 3/3 back precautions. PT Goal: Additional Goal #1 - Progress: Met  PT Treatment Precautions/Restrictions  Precautions Precautions: Back Precaution Booklet Issued: Yes (comment) Precaution Comments: Pt able to recall 3/3 back precautions Required Braces or Orthoses: Yes Spinal Brace: Lumbar corset;Applied in sitting position Restrictions Weight Bearing Restrictions: No Mobility (including Balance) Bed Mobility Bed Mobility: No Rolling Left: 5: Supervision;With rail Sitting - Scoot to Edge of Bed: 5: Supervision Sit to Sidelying Left: 5:  Supervision;With rail Transfers Transfers: Yes Sit to Stand: 3: Mod assist;From bed;From chair/3-in-1;With armrests;With upper extremity assist Sit to Stand Details (indicate cue type and reason): Pt needed extra time to scoot hips to edge of chair to attempt to stand. Stand to Sit: 4: Min assist;With upper extremity assist;With armrests;To chair/3-in-1 Stand to Sit Details: Patient required min assist to control descent into chair. Ambulation/Gait Ambulation/Gait: Yes Ambulation/Gait Assistance: 4: Min assist Ambulation/Gait Assistance Details (indicate cue type and reason): Patient required min assist to manage RW and for safety. Ambulation Distance (Feet): 100 Feet Assistive device: Rolling walker Gait Pattern: Step-to pattern;Trunk flexed Gait velocity: Slow cadence Stairs: No Wheelchair Mobility Wheelchair Mobility: No  Posture/Postural Control Posture/Postural Control: No significant limitations Balance Balance Assessed: No Exercise    End of Session PT - End of Session Equipment Utilized During Treatment: Gait belt;Back brace Activity Tolerance: Patient tolerated treatment well Patient left: in chair;with call bell in reach General Behavior During Session: [facility] for tasks performed Cognition: [facility] for tasks performed  Ezzard Standing SPT 05/06/2011, 1:13 PM

## 2011-05-06 NOTE — Progress Notes (Deleted)
Pt now on comfort feeds. Patient has not been progressing with physical therapy in the acute care setting. Physical therapy will sign off on any further care.   [name], SPT  

## 2011-05-06 NOTE — Progress Notes (Signed)
Subjective: Patient reports improving, but still limited pain medication usage due to low blood pressure  Objective: Vital signs in last 24 hours: Temp:  [98.5 F (36.9 C)-98.9 F (37.2 C)] 98.5 F (36.9 C) (03/18 0605) Pulse Rate:  [69-116] 87  (03/18 0605) Resp:  [18-20] 20  (03/18 0605) BP: (80-120)/(50-84) 80/54 mmHg (03/18 0605) SpO2:  [95 %-100 %] 98 % (03/18 0605)  Intake/Output from previous day:   Intake/Output this shift:    Physical Exam: Mobilizing slowly, but ambulating with limited assistance.  Lab Results: No results found for this basename: WBC:2,HGB:2,HCT:2,PLT:2 in the last 72 hours BMET No results found for this basename: NA:2,K:2,CL:2,CO2:2,GLUCOSE:2,BUN:2,CREATININE:2,CALCIUM:2 in the last 72 hours  Studies/Results: No results found.  Assessment/Plan: Will work on bowels and pain management, continue PT.    LOS: 3 days    Damian Buckles D, MD 05/06/2011, 7:48 AM

## 2011-05-07 NOTE — Progress Notes (Signed)
Subjective: Patient reports "I'm doing better"  Objective: Vital signs in last 24 hours: Temp:  [98.4 F (36.9 C)-99.6 F (37.6 C)] 98.8 F (37.1 C) (03/19 0927) Pulse Rate:  [86-110] 89  (03/19 0927) Resp:  [18-20] 20  (03/19 0927) BP: (90-123)/(61-74) 97/62 mmHg (03/19 0927) SpO2:  [96 %-99 %] 96 % (03/19 0927)  Intake/Output from previous day: 03/18 0701 - 03/19 0700 In: 760 [P.O.:760] Out: -  Intake/Output this shift: Total I/O In: 120 [P.O.:120] Out: -   Alert, sitting in chair, smiling today. Pain/soreness lumbar region only with activity now. Incision without erythema, drainage, or swelling.  Good strength BLE.   Lab Results:  Basename 05/06/11 1130  WBC 13.2*  HGB 11.8*  HCT 34.6*  PLT 297   BMET No results found for this basename: NA:2,K:2,CL:2,CO2:2,GLUCOSE:2,BUN:2,CREATININE:2,CALCIUM:2 in the last 72 hours  Studies/Results: No results found.  Assessment/Plan: Improved  LOS: 4 days  Per Dr. Venetia Maxon, d/c i.v.; d/c to home. Home PT & DME.   Georgiann Cocker 05/07/2011, 12:33 PM

## 2011-05-07 NOTE — Progress Notes (Signed)
Utilization review completed. Dalan Cowger, RN, BSN. 05/07/11  

## 2011-05-07 NOTE — Progress Notes (Signed)
Physical Therapy Treatment Patient Details Name: HUDSON LEHMKUHL MRN: [id number] DOB: Aug 18, 1965 Today's Date: 05/07/2011  PT Assessment/Plan  PT - Assessment/Plan Comments on Treatment Session: Pt ambulated 200 feet and ascended and descended a flight of stairs.  Patient was able to describe and adhere to her back precautions throughout the treatment session. PT Plan: Discharge plan remains appropriate;Frequency remains appropriate PT Frequency: Min 5X/week Follow Up Recommendations: Home health PT;Supervision/Assistance - 24 hour Equipment Recommended: Rolling walker with 5" wheels;3 in 1 bedside comode PT Goals  Acute Rehab PT Goals PT Goal Formulation: With patient Time For Goal Achievement: 7 days Pt will Roll Supine to Left Side: with modified independence PT Goal: Rolling Supine to Left Side - Progress: Met Pt will go Supine/Side to Sit: with modified independence PT Goal: Supine/Side to Sit - Progress: Progressing toward goal Pt will go Sit to Stand: with modified independence PT Goal: Sit to Stand - Progress: Progressing toward goal Pt will go Stand to Sit: with modified independence PT Goal: Stand to Sit - Progress: Progressing toward goal Pt will Ambulate: >150 feet;with modified independence;with least restrictive assistive device PT Goal: Ambulate - Progress: Progressing toward goal Pt will Go Up / Down Stairs: 6-9 stairs;with modified independence;with least restrictive assistive device PT Goal: Up/Down Stairs - Progress: Progressing toward goal  PT Treatment Precautions/Restrictions  Precautions Precautions: Back Precaution Booklet Issued: Yes (comment) Precaution Comments: Pt able to recall 3/3 back precautions Required Braces or Orthoses: Yes Spinal Brace: Lumbar corset;Applied in sitting position Restrictions Weight Bearing Restrictions: No Mobility (including Balance) Bed Mobility Bed Mobility: Yes Rolling Left: 6: Modified independent (Device/Increase  time);With rail Left Sidelying to Sit: 6: Modified independent (Device/Increase time);With rails;HOB flat Sitting - Scoot to Edge of Bed: 7: Independent Transfers Transfers: Yes Sit to Stand: 5: Supervision Sit to Stand Details (indicate cue type and reason): Pt requires supervision for safety. Stand to Sit: 4: Min assist;With upper extremity assist;With armrests;To chair/3-in-1 Stand to Sit Details: Patient required min assist to control descent into chair. Ambulation/Gait Ambulation/Gait: Yes Ambulation/Gait Assistance: 5: Supervision Ambulation/Gait Assistance Details (indicate cue type and reason): Patient required supervision for safety Ambulation Distance (Feet): 200 Feet Assistive device: Rolling walker Gait Pattern: Step-to pattern;Trunk flexed Gait velocity: Slow cadence Stairs: Yes Stairs Assistance: 4: Min assist Stairs Assistance Details (indicate cue type and reason): Patient required min assist for safety and VC for sequencing. Stair Management Technique: One rail Left;Step to pattern;Forwards Number of Stairs: 8  Wheelchair Mobility Wheelchair Mobility: No  Posture/Postural Control Posture/Postural Control: No significant limitations Balance Balance Assessed: No Exercise    End of Session PT - End of Session Equipment Utilized During Treatment: Gait belt;Back brace Activity Tolerance: Patient tolerated treatment well Patient left: in chair;with call bell in reach General Behavior During Session: [facility] for tasks performed Cognition: [facility] for tasks performed  Ezzard Standing SPT  05/07/2011, 12:57 PM

## 2011-05-07 NOTE — Progress Notes (Signed)
Agree with student PT treatment note. Valeriano Bain, PT DPT [phone]  

## 2011-05-07 NOTE — Progress Notes (Signed)
CSW received consult for "SNF." PT/OT is recommending home health services, which RNCM is aware and arranging. Pt to discharge home. CSW is signing off as no further social work discharge needs identified. Please reconsult if a need arises prior to discharge.   Dede Query, MSW, Theresia Majors [phone]

## 2011-05-07 NOTE — Progress Notes (Signed)
OK to D/C home

## 2011-05-07 NOTE — Discharge Summary (Signed)
Physician Discharge Summary  Patient ID: KHLOEY CHERN MRN: [id number] DOB/AGE: 48-Jul-1967 48 y.o.  Admit date: 05/03/2011 Discharge date: 05/07/2011  Admission Diagnoses: Lumbar four-five spondylolisthesis, synovial cyst, lumbar stenosis, lumbar degenerative disc disease, lumbar radiculopathy   Discharge Diagnoses: Lumbar four-five spondylolisthesis, synovial cyst, lumbar stenosis, lumbar degenerative disc disease, lumbar radiculopathy s/p POSTERIOR LUMBAR Decompression (in excess of required for interbody fusion) with resection of synovial cyst, PLIF with PEEK cages, BMP, autograft, Nonsegmental pedicle screw instrumentation L 4-5 with posterolateral arthrodesis L4-5 (Bilateral)    Active Problems:  * No active hospital problems. *    Discharged Condition: good  Hospital Course: This 46 y.o. Female was admitted for surgery to correct Lumbar four-five spondylolisthesis, synovial cyst, lumbar stenosis, lumbar degenerative disc disease, lumbar radiculopathy.  Following an uncomplicated POSTERIOR LUMBAR Decompression (in excess of required for interbody fusion) with resection of synovial cyst, PLIF with PEEK cages, BMP, autograft, Nonsegmental pedicle screw instrumentation L 4-5 with posterolateral arthrodesis L4-5 (Bilateral) and recovery in Neuro PACU, pt was transferred to 3000 for therapy and pain control.      Consults: None  Significant Diagnostic Studies: radiology: X-Ray: intra-operative  Treatments: surgery: POSTERIOR LUMBAR Decompression (in excess of required for interbody fusion) with resection of synovial cyst, PLIF with PEEK cages, BMP, autograft, Nonsegmental pedicle screw instrumentation L 4-5 with posterolateral arthrodesis L4-5 (Bilateral)    Discharge Exam: Blood pressure 97/62, pulse 89, temperature 98.8 F (37.1 C), temperature source Oral, resp. rate 20, height 5\' 6"  (1.676 m), weight 92.534 kg (204 lb), SpO2 96.00%. Alert, sitting in chair, smiling today.  Pain/soreness lumbar region only with activity now. Incision without erythema, drainage, or swelling.  Good strength BLE.    Disposition: d/c to home - self care.    Medication List  As of 05/07/2011 12:38 PM   ASK your doctor about these medications         methocarbamol 500 MG tablet   Commonly known as: ROBAXIN   Take 500 mg by mouth 4 (four) times daily.      polyethylene glycol powder powder   Commonly known as: GLYCOLAX/MIRALAX   Take 17 g by mouth daily.      vitamin B-12 500 MCG tablet   Commonly known as: CYANOCOBALAMIN   Take 500 mcg by mouth daily.             Signed: Georgiann Cocker 05/07/2011, 12:38 PM

## 2011-05-13 MED FILL — Sodium Chloride IV Soln 0.9%: INTRAVENOUS | Qty: 1000 | Status: AC

## 2011-05-13 MED FILL — Heparin Sodium (Porcine) Inj 1000 Unit/ML: INTRAMUSCULAR | Qty: 30 | Status: AC

## 2012-04-16 ENCOUNTER — Other Ambulatory Visit: Payer: Self-pay | Admitting: Otolaryngology

## 2012-04-16 DIAGNOSIS — H905 Unspecified sensorineural hearing loss: Secondary | ICD-10-CM

## 2012-04-29 ENCOUNTER — Other Ambulatory Visit: Payer: Worker's Compensation

## 2012-05-13 ENCOUNTER — Ambulatory Visit
Admission: RE | Admit: 2012-05-13 | Discharge: 2012-05-13 | Disposition: A | Discharge status: Home / Self Care | Payer: BC Managed Care – PPO | Source: Ambulatory Visit | Attending: Otolaryngology | Admitting: Otolaryngology

## 2012-05-13 DIAGNOSIS — H905 Unspecified sensorineural hearing loss: Secondary | ICD-10-CM

## 2012-05-13 DIAGNOSIS — H9319 Tinnitus, unspecified ear: Secondary | ICD-10-CM

## 2012-05-13 MED ORDER — GADOBENATE DIMEGLUMINE 529 MG/ML IV SOLN
20.0000 mL | Freq: Once | INTRAVENOUS | Status: AC | PRN
  Administered 2012-05-13: 20 mL via INTRAVENOUS

## 2013-05-05 IMAGING — CR DG LUMBAR SPINE 1V
1 series · 1 of 1 positions shown · non-contrast
Comparison: MRI dated 11/26/2010

CLINICAL DATA: Lumbar spondylosis.

LUMBAR SPINE - 1 VIEW

[view not recorded]
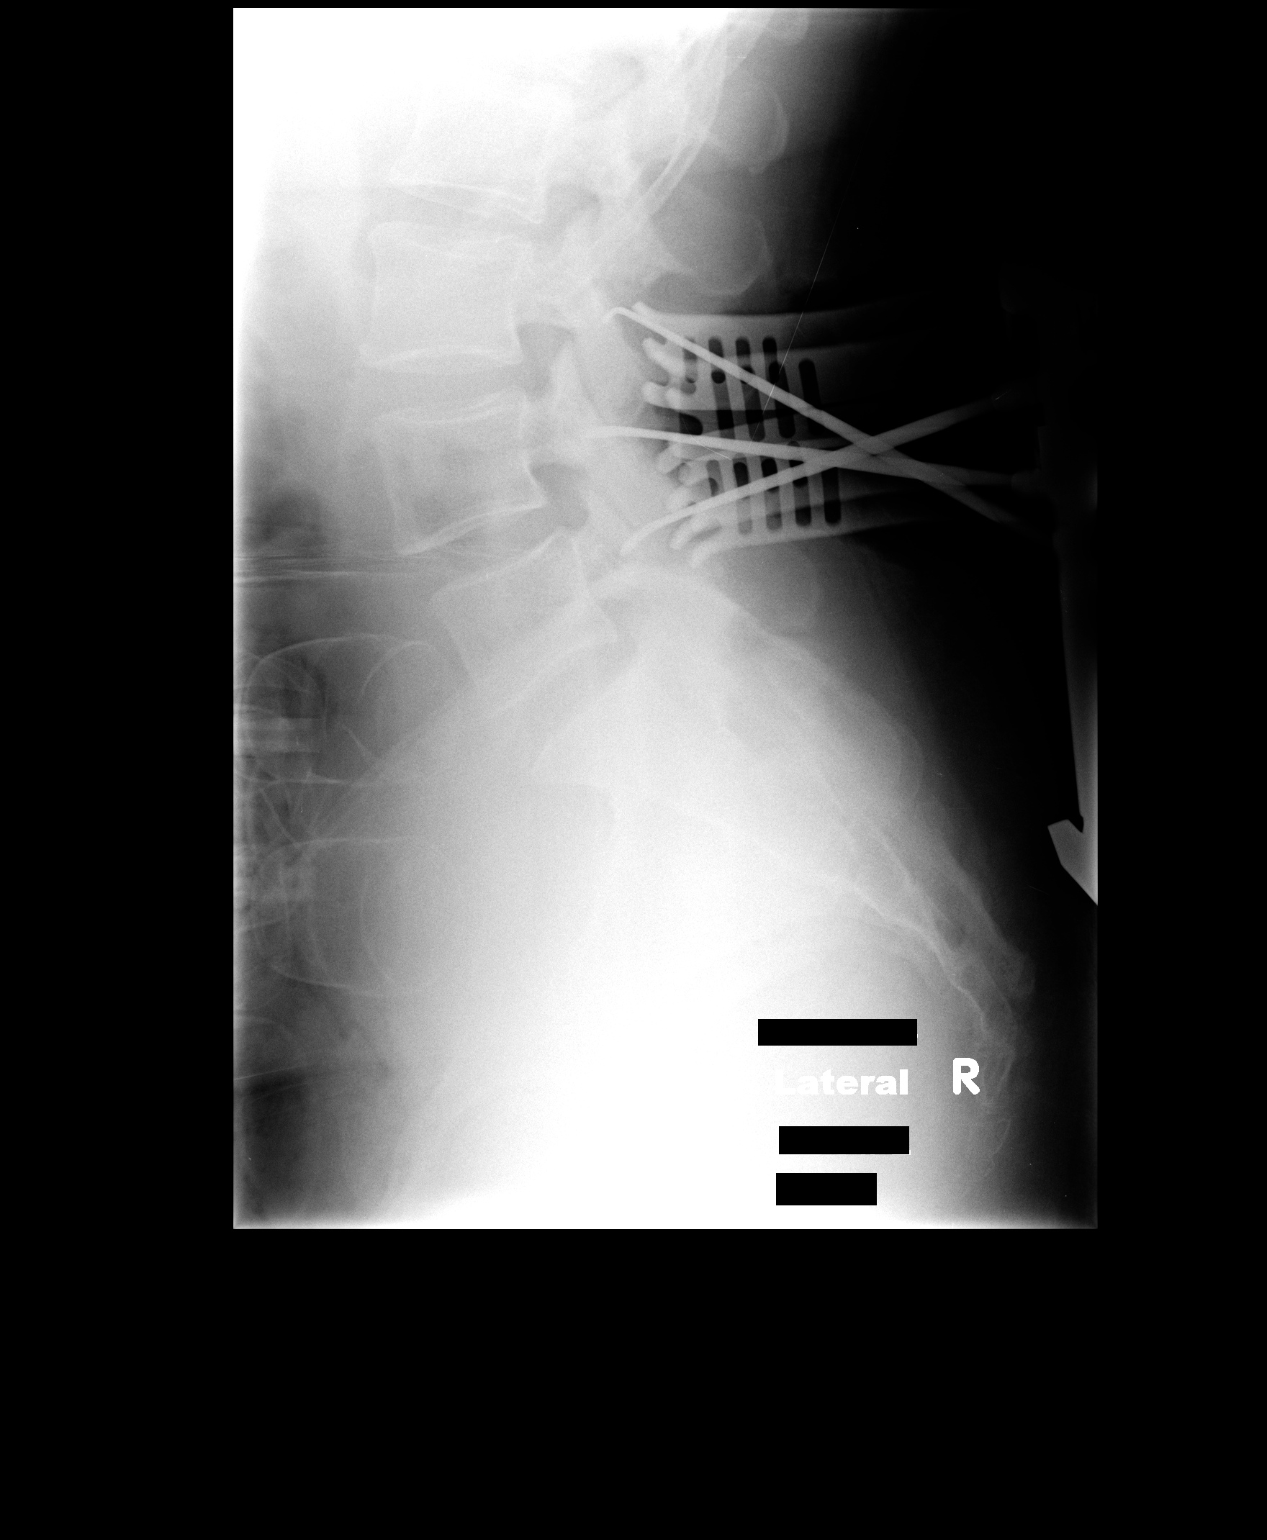

[1 of 1 positions shown; findings below may reference images not displayed]

FINDINGS: There are instruments in place at the level of the
inferior aspect of the pedicles of L3, at the level of the pedicles
of L4, and at the level of the pedicles of L5.
IMPRESSION: Instruments at L3, L4, and L5.

## 2013-05-05 IMAGING — RF DG LUMBAR SPINE COMPLETE 4+V
1 series · 5 of 5 positions shown · non-contrast
Comparison: 05/02/2001 intraoperative radiograph of 10/08/1988;
lumbar spine radiographs of 12/31/2010 from the then thyroid.

CLINICAL DATA: L4-5 fusion

LUMBAR SPINE - COMPLETE 4+ VIEW

[Series 1: run · 5 of 5 slices shown]
[im 1/5]
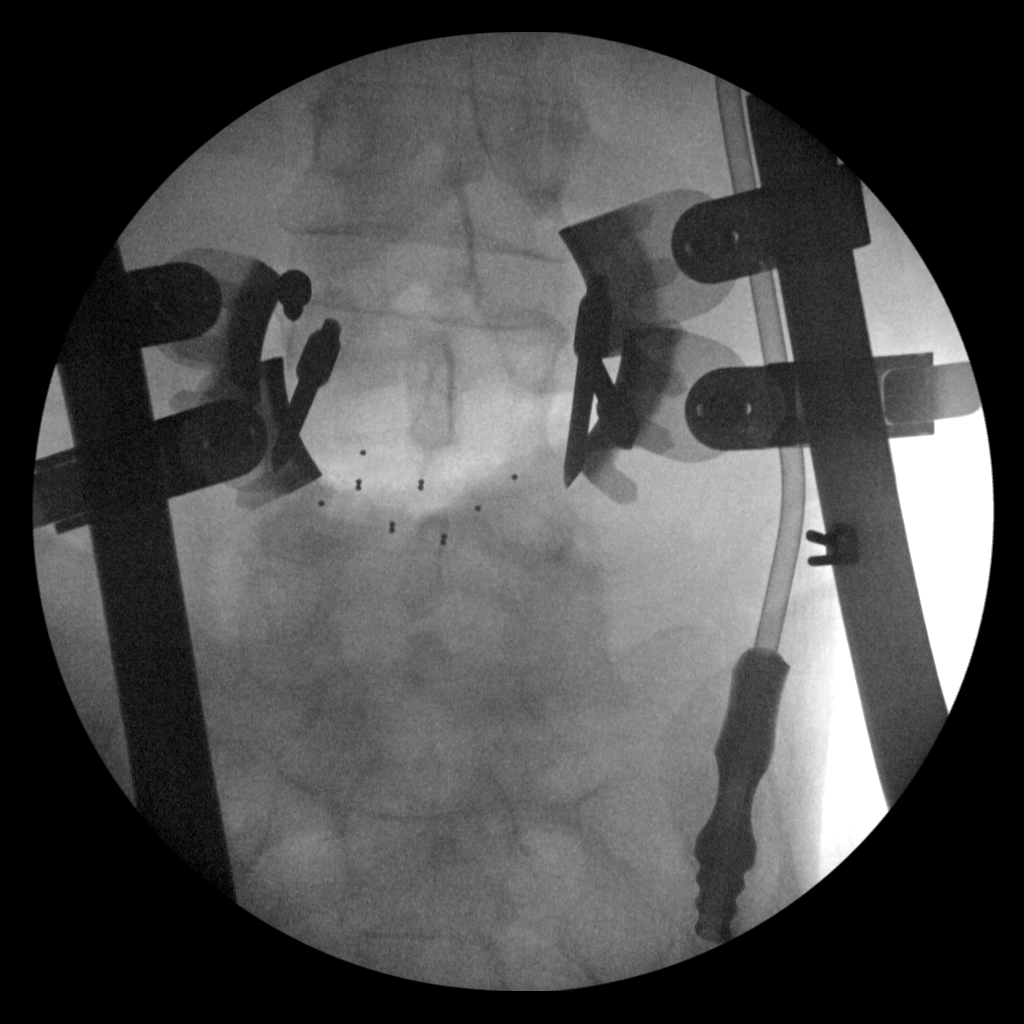
[im 2/5]
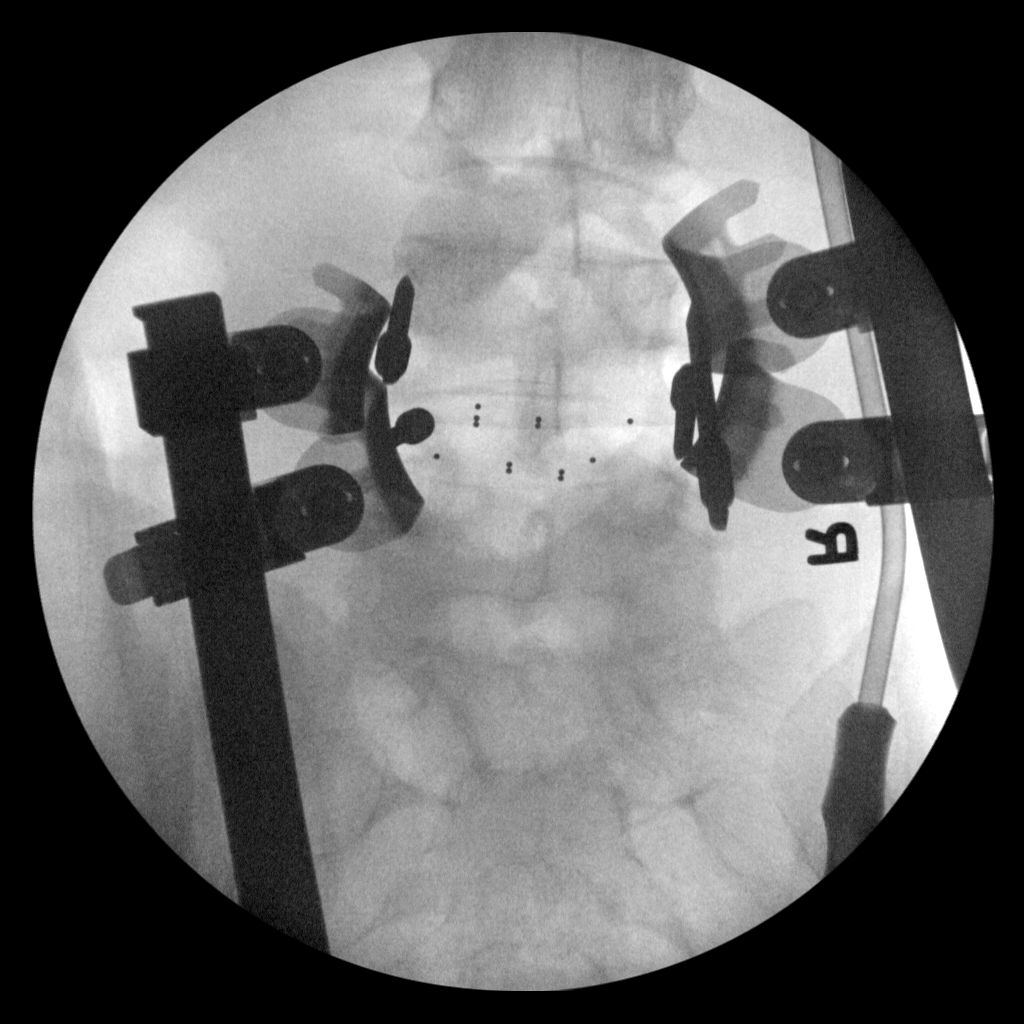
[im 3/5]
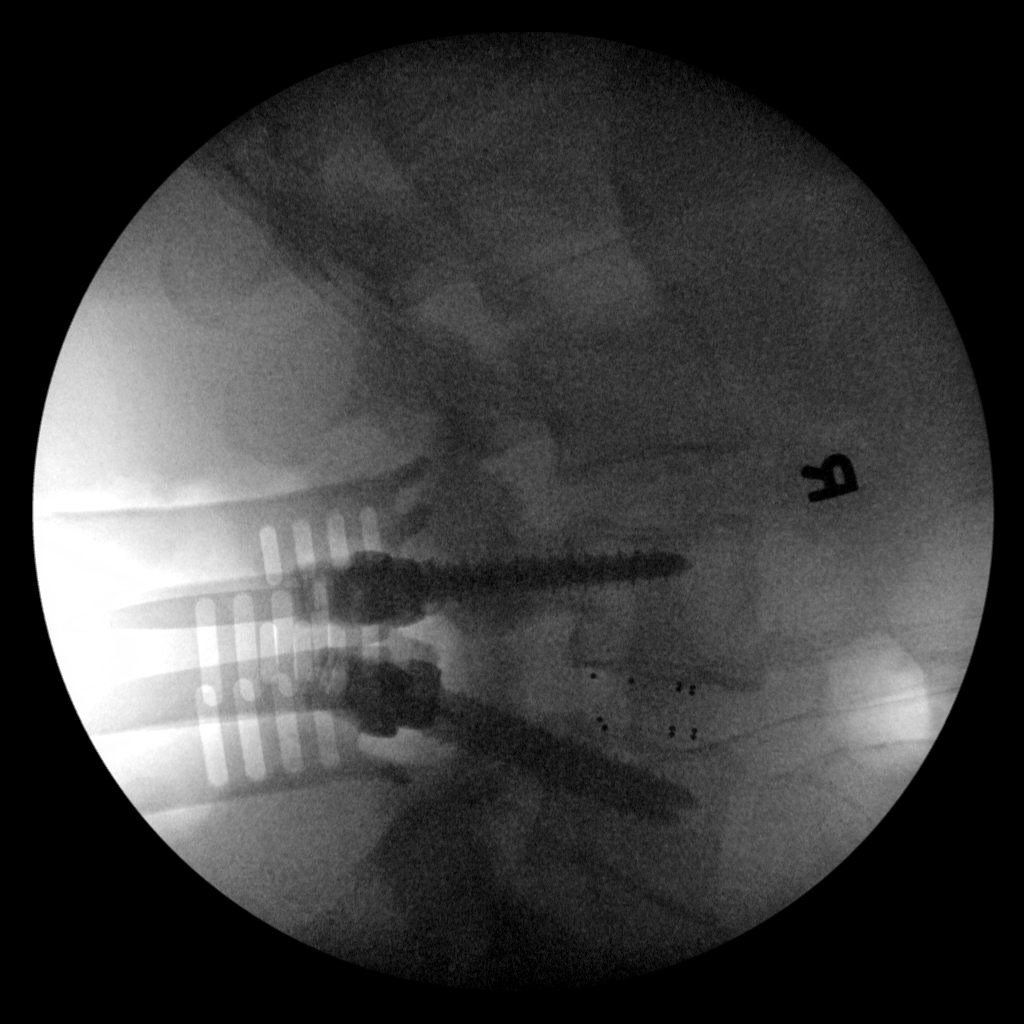
[im 4/5]
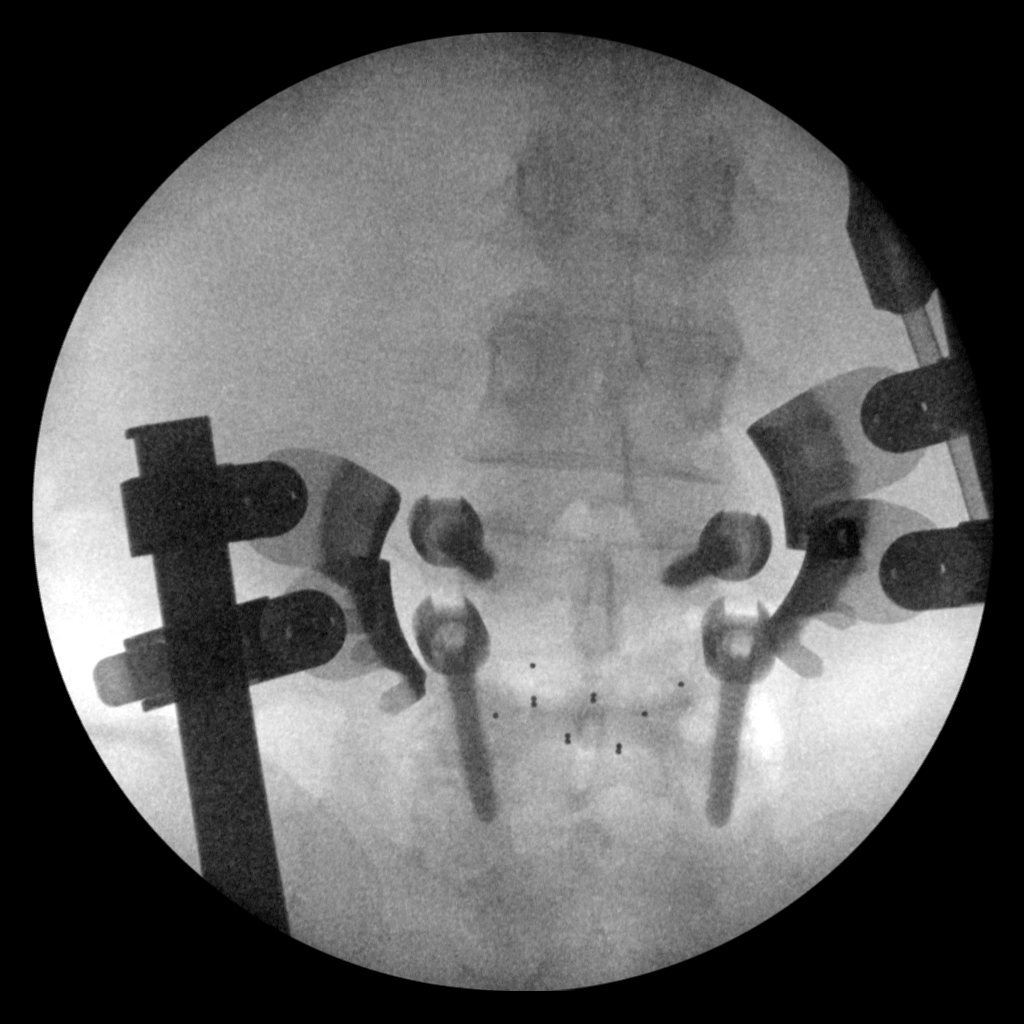
[im 5/5]
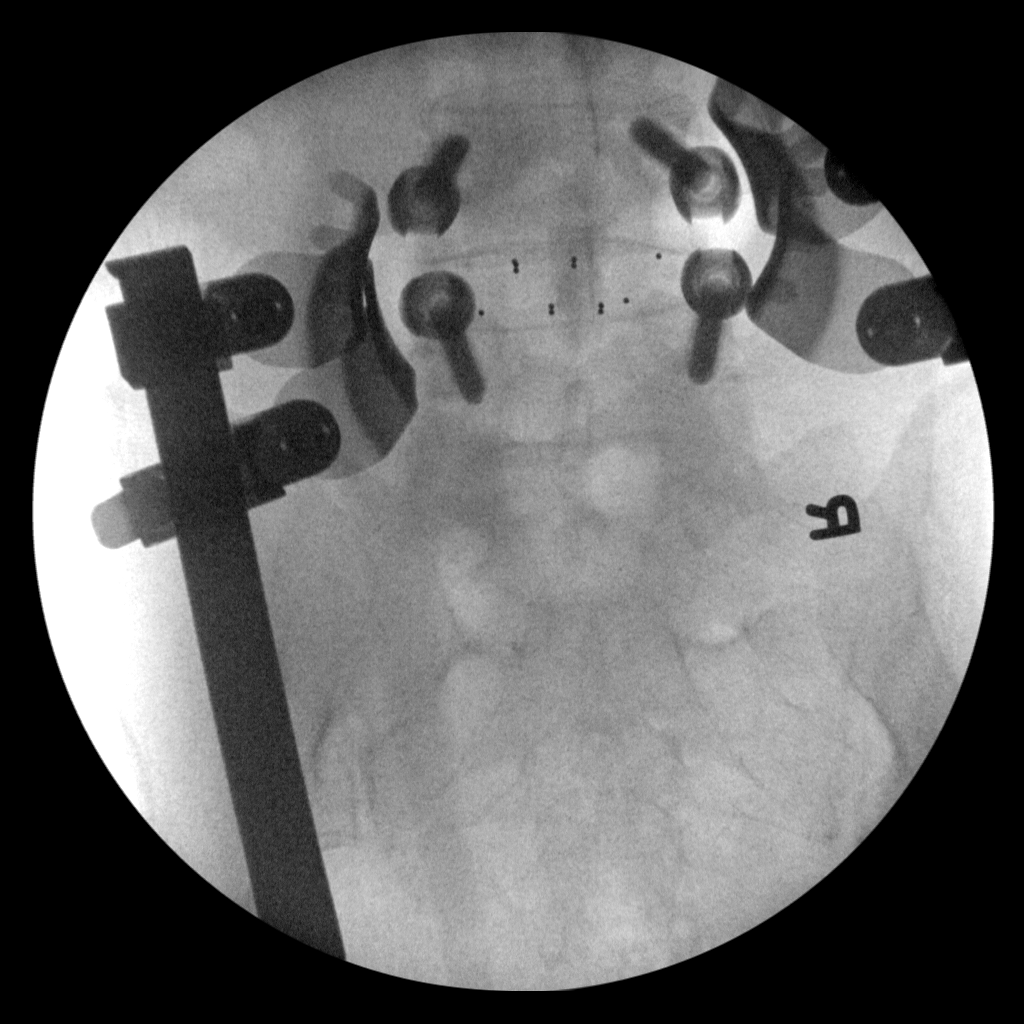

[5 of 5 positions shown; findings below may reference images not displayed]

FINDINGS: A series of five fluoroscopic spot images the lumbar
spine was obtained intraoperatively.

Image from [DATE] shows tissue spreaders in place and spacer at
the L4-5 level.

Image from [DATE] shows similar findings, with pedicle localizer.

Image from [DATE] shows placement of pedicle screws at L4-L5, and
the next image shows the screws from a different angle.

The image from [DATE] shows the pedicle screws from a lateral
projection at the L4-5 level, without complicating feature
observed.
IMPRESSION: 1.  Series of the lumbar spine radiographs demonstrates L4-5
pedicle screw placement, without complicating feature observed.

## 2014-05-12 ENCOUNTER — Encounter (HOSPITAL_COMMUNITY): Payer: Self-pay | Admitting: Emergency Medicine

## 2014-05-12 ENCOUNTER — Emergency Department (HOSPITAL_COMMUNITY): Payer: Self-pay

## 2014-05-12 ENCOUNTER — Emergency Department (HOSPITAL_COMMUNITY)
Admission: EM | Admit: 2014-05-12 | Discharge: 2014-05-12 | Disposition: A | Discharge status: Home / Self Care | Payer: Self-pay | Attending: Emergency Medicine | Admitting: Emergency Medicine

## 2014-05-12 ENCOUNTER — Emergency Department (INDEPENDENT_AMBULATORY_CARE_PROVIDER_SITE_OTHER)
Admission: EM | Admit: 2014-05-12 | Discharge: 2014-05-12 | Disposition: A | Discharge status: Nursing Home (Includes ICF) | Payer: Self-pay | Source: Home / Self Care | Attending: Family Medicine | Admitting: Family Medicine

## 2014-05-12 DIAGNOSIS — R1011 Right upper quadrant pain: Secondary | ICD-10-CM

## 2014-05-12 DIAGNOSIS — R197 Diarrhea, unspecified: Secondary | ICD-10-CM | POA: Insufficient documentation

## 2014-05-12 DIAGNOSIS — Z79899 Other long term (current) drug therapy: Secondary | ICD-10-CM | POA: Insufficient documentation

## 2014-05-12 DIAGNOSIS — R630 Anorexia: Secondary | ICD-10-CM | POA: Insufficient documentation

## 2014-05-12 DIAGNOSIS — R1012 Left upper quadrant pain: Secondary | ICD-10-CM | POA: Insufficient documentation

## 2014-05-12 DIAGNOSIS — R509 Fever, unspecified: Secondary | ICD-10-CM

## 2014-05-12 DIAGNOSIS — Z87891 Personal history of nicotine dependence: Secondary | ICD-10-CM | POA: Insufficient documentation

## 2014-05-12 DIAGNOSIS — R112 Nausea with vomiting, unspecified: Secondary | ICD-10-CM | POA: Insufficient documentation

## 2014-05-12 DIAGNOSIS — J069 Acute upper respiratory infection, unspecified: Secondary | ICD-10-CM | POA: Insufficient documentation

## 2014-05-12 DIAGNOSIS — D649 Anemia, unspecified: Secondary | ICD-10-CM | POA: Insufficient documentation

## 2014-05-12 DIAGNOSIS — G8929 Other chronic pain: Secondary | ICD-10-CM | POA: Insufficient documentation

## 2014-05-12 LAB — CBC WITH DIFFERENTIAL/PLATELET
BASOS ABS: 0 10*3/uL (ref 0.0–0.1)
Basophils Relative: 0 % (ref 0–1)
EOS ABS: 0 10*3/uL (ref 0.0–0.7)
Eosinophils Relative: 0 % (ref 0–5)
HEMATOCRIT: 40.1 % (ref 36.0–46.0)
HEMOGLOBIN: 13.1 g/dL (ref 12.0–15.0)
Lymphocytes Relative: 13 % (ref 12–46)
Lymphs Abs: 1.1 10*3/uL (ref 0.7–4.0)
MCH: 30.5 pg (ref 26.0–34.0)
MCHC: 32.7 g/dL (ref 30.0–36.0)
MCV: 93.3 fL (ref 78.0–100.0)
MONO ABS: 0.5 10*3/uL (ref 0.1–1.0)
MONOS PCT: 6 % (ref 3–12)
NEUTROS ABS: 6.7 10*3/uL (ref 1.7–7.7)
NEUTROS PCT: 81 % — AB (ref 43–77)
Platelets: 296 10*3/uL (ref 150–400)
RBC: 4.3 MIL/uL (ref 3.87–5.11)
RDW: 13.9 % (ref 11.5–15.5)
WBC: 8.3 10*3/uL (ref 4.0–10.5)

## 2014-05-12 LAB — COMPREHENSIVE METABOLIC PANEL
ALBUMIN: 3.6 g/dL (ref 3.5–5.2)
ALT: 16 U/L (ref 0–35)
ANION GAP: 12 (ref 5–15)
AST: 21 U/L (ref 0–37)
Alkaline Phosphatase: 103 U/L (ref 39–117)
BUN: 6 mg/dL (ref 6–23)
CALCIUM: 9 mg/dL (ref 8.4–10.5)
CO2: 24 mmol/L (ref 19–32)
CREATININE: 0.67 mg/dL (ref 0.50–1.10)
Chloride: 97 mmol/L (ref 96–112)
GFR calc Af Amer: >90 mL/min (ref >90)
GFR calc non Af Amer: >90 mL/min (ref >90)
Glucose, Bld: 91 mg/dL (ref 70–99)
Potassium: 3.7 mmol/L (ref 3.5–5.1)
Sodium: 133 mmol/L — ABNORMAL LOW (ref 135–145)
Total Bilirubin: 0.6 mg/dL (ref 0.3–1.2)
Total Protein: 7.5 g/dL (ref 6.0–8.3)

## 2014-05-12 LAB — LIPASE, BLOOD: Lipase: 30 U/L (ref 11–59)

## 2014-05-12 MED ORDER — ONDANSETRON 4 MG PO TBDP
8.0000 mg | ORAL_TABLET | Freq: Once | ORAL | Status: AC
  Administered 2014-05-12: 8 mg via ORAL

## 2014-05-12 MED ORDER — ACETAMINOPHEN 325 MG PO TABS
650.0000 mg | ORAL_TABLET | Freq: Once | ORAL | Status: AC
  Administered 2014-05-12: 650 mg via ORAL

## 2014-05-12 MED ORDER — KETOROLAC TROMETHAMINE 30 MG/ML IJ SOLN
30.0000 mg | Freq: Once | INTRAMUSCULAR | Status: AC
  Administered 2014-05-12: 30 mg via INTRAVENOUS
  Filled 2014-05-12: qty 1

## 2014-05-12 MED ORDER — ACETAMINOPHEN 325 MG PO TABS
ORAL_TABLET | ORAL | Status: AC
  Filled 2014-05-12: qty 2

## 2014-05-12 MED ORDER — ONDANSETRON 4 MG PO TBDP
ORAL_TABLET | ORAL | Status: DC
Start: 2014-05-12 — End: 2014-05-13
  Filled 2014-05-12: qty 2

## 2014-05-12 NOTE — ED Notes (Signed)
Pt returned from US

## 2014-05-12 NOTE — ED Provider Notes (Signed)
[name] is a 49 y.o. female who presents to Urgent Care today for fever or body aches headache cough abdominal pain and vomiting and diarrhea. Symptoms present for 2 days she's tried over-the-counter medications which have not helped much. She received a flu vaccine this year. She denies any history of abdominal surgery.   Past Medical History  Diagnosis Date  . Anemia   . Carpal tunnel syndrome on left   . Chronic lower back pain    Past Surgical History  Procedure Laterality Date  . Posterior fusion lumbar spine  05/03/11    "& put in 3 screws"  . Shoulder arthroscopy w/ rotator cuff repair  11/2008    left  . Shoulder open rotator cuff repair  02/2010    right  . Anterior cervical decomp/discectomy fusion  07/2008    C4-5; C5-6  . Vaginal hysterectomy  10/2008    w/partial right salpingectomy  . Tubal ligation  1980's  . Carpal tunnel release  2010    left   History  Substance Use Topics  . Smoking status: Former Smoker -- 2.00 packs/day for 11 years    Types: Cigarettes    Quit date: 02/18/1990  . Smokeless tobacco: Never Used  . Alcohol Use: Yes     Comment: 05/03/11 "drink a little once or twice a year"   ROS as above Medications: No current facility-administered medications for this encounter.   Current Outpatient Prescriptions  Medication Sig Dispense Refill  . methocarbamol (ROBAXIN) 500 MG tablet Take 500 mg by mouth 4 (four) times daily.    . polyethylene glycol powder (GLYCOLAX/MIRALAX) powder Take 17 g by mouth daily.    . vitamin B-12 (CYANOCOBALAMIN) 500 MCG tablet Take 500 mcg by mouth daily.     No Known Allergies   Exam:  BP 161/97 mmHg  Pulse 103  Temp(Src) 101.5 F (38.6 C) (Oral)  Resp 20  SpO2 96% Gen: Well NAD fatigued appearing HEENT: EOMI,  MMM Lungs: Normal work of breathing. CTABL frequent coughing Heart: RRR no MRG Abd: NABS, Soft. Nondistended, tender palpation right upper quadrant with positive Murphy sign Exts: Brisk  capillary refill, warm and well perfused.   No results found for this or any previous visit (from the past 24 hour(s)). No results found.  Assessment and Plan: 49 y.o. female with fever and abdominal pain in the setting of a cough. Patient has abdominal pain with a positive Murphy sign in the setting of fever. This is concerning for acute cholecystitis. The cough is also concerning. I'm unclear if patient has flu or flu and acute cholecystitis or just acute cholecystitis..  Transfer to ED for further evaluation and management.  Discussed warning signs or symptoms. Please see discharge instructions. Patient expresses understanding.     Gregor Hams, MD 05/12/14 [phone]

## 2014-05-12 NOTE — ED Notes (Signed)
Patient transported to US 

## 2014-05-12 NOTE — ED Provider Notes (Signed)
CSN: [id number]     Arrival date & time 05/12/14  1703 History   First MD Initiated Contact with Patient 05/12/14 2006     Chief Complaint  Patient presents with  . Generalized Body Aches  . Abdominal Pain     (Consider location/radiation/quality/duration/timing/severity/associated sxs/prior Treatment) Patient is a 49 y.o. female presenting with abdominal pain. The history is provided by the patient and medical records. No language interpreter was used.  Abdominal Pain Pain location:  RUQ and LUQ Pain quality: throbbing   Pain radiates to:  Does not radiate Pain severity:  Moderate Onset quality:  Sudden Duration:  2 days Timing:  Intermittent Progression:  Worsening Chronicity:  New Context: not suspicious food intake   Ineffective treatments:  OTC medications Associated symptoms: anorexia, chills, cough, diarrhea, fever, nausea, sore throat and vomiting     Past Medical History  Diagnosis Date  . Anemia   . Carpal tunnel syndrome on left   . Chronic lower back pain    Past Surgical History  Procedure Laterality Date  . Posterior fusion lumbar spine  05/03/11    "& put in 3 screws"  . Shoulder arthroscopy w/ rotator cuff repair  11/2008    left  . Shoulder open rotator cuff repair  02/2010    right  . Anterior cervical decomp/discectomy fusion  07/2008    C4-5; C5-6  . Vaginal hysterectomy  10/2008    w/partial right salpingectomy  . Tubal ligation  1980's  . Carpal tunnel release  2010    left   No family history on file. History  Substance Use Topics  . Smoking status: Former Smoker -- 2.00 packs/day for 11 years    Types: Cigarettes    Quit date: 02/18/1990  . Smokeless tobacco: Never Used  . Alcohol Use: Yes     Comment: 05/03/11 "drink a little once or twice a year"   OB History    No data available     Review of Systems  Constitutional: Positive for fever and chills.  HENT: Positive for rhinorrhea and sore throat.   Respiratory: Positive for cough.    Gastrointestinal: Positive for nausea, vomiting, abdominal pain, diarrhea and anorexia.  Musculoskeletal: Positive for myalgias.  Skin: Negative for rash.  All other systems reviewed and are negative.     Allergies  Review of patient's allergies indicates no known allergies.  Home Medications   Prior to Admission medications   Medication Sig Start Date End Date Taking? Authorizing Provider  methocarbamol (ROBAXIN) 500 MG tablet Take 500 mg by mouth 4 (four) times daily.    Historical Provider, MD  polyethylene glycol powder (GLYCOLAX/MIRALAX) powder Take 17 g by mouth daily.    Historical Provider, MD  vitamin B-12 (CYANOCOBALAMIN) 500 MCG tablet Take 500 mcg by mouth daily.    Historical Provider, MD   BP 115/63 mmHg  Pulse 109  Temp(Src) 100.2 F (37.9 C) (Oral)  Resp 20  Ht 5\' 6"  (1.676 m)  Wt 196 lb (88.905 kg)  BMI 31.65 kg/m2  SpO2 99% Physical Exam  Constitutional: She is oriented to person, place, and time. She appears well-developed and well-nourished.  HENT:  Head: Normocephalic and atraumatic.  Eyes: EOM are normal. Pupils are equal, round, and reactive to light.  Neck: Neck supple.  Cardiovascular: Normal rate and regular rhythm.   Pulmonary/Chest: Effort normal and breath sounds normal.  Abdominal: Soft. There is tenderness.  Musculoskeletal: She exhibits no edema.  Lymphadenopathy:    She has no  cervical adenopathy.  Neurological: She is alert and oriented to person, place, and time.  Skin: Skin is warm and dry.  Psychiatric: She has a normal mood and affect.  Nursing note and vitals reviewed.   ED Course  Procedures (including critical care time) Labs Review Labs Reviewed  CBC WITH DIFFERENTIAL/PLATELET - Abnormal; Notable for the following:    Neutrophils Relative % 81 (*)    All other components within normal limits  COMPREHENSIVE METABOLIC PANEL - Abnormal; Notable for the following:    Sodium 133 (*)    All other components within normal  limits  LIPASE, BLOOD    Imaging Review Dg Chest 2 View  05/12/2014   CLINICAL DATA:  Generalized body aches and fatigue since Tuesday. Generalized abdominal pain. Former smoker.  EXAM: CHEST  2 VIEW  COMPARISON:  11/10/2010; 02/23/2009  FINDINGS: Grossly unchanged cardiac silhouette and mediastinal contours. No focal parenchymal opacities. There is minimal pleural parenchymal thickening about the right minor and bilateral major fissures. No pleural effusion or pneumothorax. No evidence of edema. No acute osseus abnormalities. Post lower cervical ACDF, incompletely evaluated.  IMPRESSION: No acute cardiopulmonary disease. Specifically, no evidence of pneumonia.   Electronically Signed   By: Sandi Mariscal M.D.   On: 05/12/2014 18:23     EKG Interpretation None     Patient seen at [facility] for evaluation of upper respiratory symptoms along with nausea/vomiting/diarrhea onset Tuesday.  Patient with significant tenderness in RUQ--concern for cholecystitis.  LFTS normal. No leukocytosis.  No pneumonia or other acute findings on CXR.  No acute abnormality noted on abdominal ultrasound.  Patient feels better after IV fluids and medication.  Lab results shared with patient.    Viral illness.  Home with symptomatic treatment instructions.  Return precautions discussed.  Patient to follow-up with her [facility] MDM   Final diagnoses:  None    Viral illness.    Etta Quill, NP 05/12/14 [id number]  Sherwood Gambler, MD 05/17/14 [phone]

## 2014-05-12 NOTE — Discharge Instructions (Signed)

## 2014-05-12 NOTE — ED Notes (Signed)
C/o  Fever,  Body aches.  Hot/cold chills,  N/v/d, productive cough with yellow/green sputum.  No relief with otc meds.  Acute onset.   Symptoms present since 3/22.

## 2014-05-12 NOTE — ED Notes (Signed)
Pt to ED from [facility] for further evaluation of generalized body aches and fatigue since Tuesday.  Pt also has generalized abdominal pain with N/V/D onset Tuesday.

## 2018-08-24 DIAGNOSIS — M722 Plantar fascial fibromatosis: Secondary | ICD-10-CM | POA: Insufficient documentation

## 2019-11-25 ENCOUNTER — Ambulatory Visit (INDEPENDENT_AMBULATORY_CARE_PROVIDER_SITE_OTHER): Payer: 59 | Admitting: Plastic Surgery

## 2019-11-25 ENCOUNTER — Other Ambulatory Visit: Payer: Self-pay

## 2019-11-25 ENCOUNTER — Encounter: Payer: Self-pay | Admitting: Plastic Surgery

## 2019-11-25 VITALS — BP 158/84 | HR 86 | Temp 98.8°F | Ht 66.0 in | Wt 226.6 lb

## 2019-11-25 DIAGNOSIS — E785 Hyperlipidemia, unspecified: Secondary | ICD-10-CM | POA: Insufficient documentation

## 2019-11-25 DIAGNOSIS — M546 Pain in thoracic spine: Secondary | ICD-10-CM

## 2019-11-25 DIAGNOSIS — N62 Hypertrophy of breast: Secondary | ICD-10-CM | POA: Diagnosis not present

## 2019-11-25 DIAGNOSIS — M4004 Postural kyphosis, thoracic region: Secondary | ICD-10-CM

## 2019-11-25 DIAGNOSIS — N6001 Solitary cyst of right breast: Secondary | ICD-10-CM

## 2019-11-25 DIAGNOSIS — M545 Low back pain, unspecified: Secondary | ICD-10-CM

## 2019-11-25 DIAGNOSIS — E119 Type 2 diabetes mellitus without complications: Secondary | ICD-10-CM | POA: Insufficient documentation

## 2019-11-25 NOTE — Progress Notes (Signed)
Referring Provider Jonathon Jordan, MD [address],  [place] [address]   CC:  Chief Complaint  Patient presents with  . Advice Only      [name] is an 54 y.o. female.  HPI: Patient presents to discuss breast reduction.  She is had years of back pain, neck pain and shoulder grooving related to her large breasts.  She is tried over-the-counter medications, warm packs, cold packs and compressive garments with little relief.  She is currently a double D and wants to be a C or D cup.  Her maternal aunt had breast cancer.  She gets rashes beneath her breast that been refractory to over-the-counter medications.  She does not smoke and is nondiabetic.  No Known Allergies  Outpatient Encounter Medications as of 11/25/2019  Medication Sig  . polyethylene glycol powder (GLYCOLAX/MIRALAX) powder Take 17 g by mouth daily.   No facility-administered encounter medications on file as of 11/25/2019.     Past Medical History:  Diagnosis Date  . Anemia   . Carpal tunnel syndrome on left   . Chronic lower back pain     Past Surgical History:  Procedure Laterality Date  . ANTERIOR CERVICAL DECOMP/DISCECTOMY FUSION  07/2008   C4-5; C5-6  . CARPAL TUNNEL RELEASE  2010   left  . POSTERIOR FUSION LUMBAR SPINE  05/03/11   "& put in 3 screws"  . SHOULDER ARTHROSCOPY W/ ROTATOR CUFF REPAIR  11/2008   left  . SHOULDER OPEN ROTATOR CUFF REPAIR  02/2010   right  . TUBAL LIGATION  1980's  . VAGINAL HYSTERECTOMY  10/2008   w/partial right salpingectomy    No family history on file.  Social History   Social History Narrative  . Not on file  Denies tobacco use  Review of Systems General: Denies fevers, chills, weight loss CV: Denies chest pain, shortness of breath, palpitations  Physical Exam Vitals with BMI 11/25/2019 05/12/2014 05/12/2014  Height 5\' 6"  - -  Weight 226 lbs 10 oz - -  BMI 27.25 - -  Systolic 366 440 347  Diastolic 84 73 73  Pulse 86 79 81      General:  No acute distress,  Alert and oriented, Non-Toxic, Normal speech and affect Breast: She has grade 3 ptosis.  Sternal notch to nipple is 33 cm bilaterally.  Nipple to fold is 16 cm on the right and 15 cm on the left.  She has a scar in the upper outer quadrant of the right breast from a previous cyst excision and she would like that to be excised at the same time.  Assessment/Plan The patient has bilateral symptomatic macromastia.  She is a good candidate for a breast reduction.  She is interested in pursuing surgical treatment.  She has tried supportive garments and fitted bras with no relief.  The details of breast reduction surgery were discussed.  I explained the procedure in detail along the with the expected scars.  The risks were discussed in detail and include bleeding, infection, damage to surrounding structures, need for additional procedures, nipple loss, change in nipple sensation, persistent pain, contour irregularities and asymmetries.  I explained that breast feeding is often not possible after breast reduction surgery.  We discussed the expected postoperative course with an overall recovery period of about 1 month.  She demonstrated full understanding of all risks.  We discussed her personal risk factors that include excising the breast cyst at the same time.  I  do not think this would fall within the typical skin pattern and may put her at slightly increased risk of wound healing complications.  I anticipate approximately 900 g of tissue removed from each side.   Cindra Presume 11/25/2019, 9:21 AM

## 2019-12-13 ENCOUNTER — Ambulatory Visit (INDEPENDENT_AMBULATORY_CARE_PROVIDER_SITE_OTHER): Payer: 59

## 2019-12-13 ENCOUNTER — Other Ambulatory Visit: Payer: Self-pay

## 2019-12-13 DIAGNOSIS — Z23 Encounter for immunization: Secondary | ICD-10-CM | POA: Diagnosis not present

## 2019-12-13 NOTE — Progress Notes (Signed)
   Covid-19 Vaccination Clinic  Name:  [name]    MRN: [id number] DOB: Dec 13, 1965  12/13/2019  [name] was observed post Covid-19 immunization for 15 minutes without incident. She was provided with Vaccine Information Sheet and instruction to access the V-Safe system.   [name] was instructed to call 911 with any severe reactions post vaccine: Marland Kitchen Difficulty breathing  . Swelling of face and throat  . A fast heartbeat  . A bad rash all over body  . Dizziness and weakness   Immunizations Administered    Name Date Dose VIS Date Route   Pfizer COVID-19 Vaccine 12/13/2019 10:22 AM 0.3 mL 04/14/2018 Intramuscular   Manufacturer: [place]   Lot: [place]: [place]

## 2020-04-05 ENCOUNTER — Other Ambulatory Visit: Payer: Self-pay

## 2020-04-05 ENCOUNTER — Ambulatory Visit (INDEPENDENT_AMBULATORY_CARE_PROVIDER_SITE_OTHER): Payer: 59 | Admitting: Plastic Surgery

## 2020-04-05 ENCOUNTER — Encounter: Payer: Self-pay | Admitting: Plastic Surgery

## 2020-04-05 VITALS — BP 122/8 | HR 99

## 2020-04-05 DIAGNOSIS — M546 Pain in thoracic spine: Secondary | ICD-10-CM

## 2020-04-05 DIAGNOSIS — M545 Low back pain, unspecified: Secondary | ICD-10-CM

## 2020-04-05 DIAGNOSIS — M4004 Postural kyphosis, thoracic region: Secondary | ICD-10-CM

## 2020-04-05 DIAGNOSIS — N62 Hypertrophy of breast: Secondary | ICD-10-CM | POA: Diagnosis not present

## 2020-04-05 NOTE — Progress Notes (Signed)
Referring Provider Jonathon Jordan, MD [place] [address],  [place] [address]   CC:     Chief Complaint  Patient presents with  . Advice Only      [name] is an 55 y.o. female.  HPI: Patient presents to discuss breast reduction.  She is had years of back pain, neck pain and shoulder grooving related to her large breasts.  She is tried over-the-counter medications, warm packs, cold packs and compressive garments with little relief.  She is currently a double D and wants to be a C or D cup.  Her maternal aunt had breast cancer.  She gets rashes beneath her breast that been refractory to over-the-counter medications.  She does not smoke.  I have seen her for this previously.  At that time her hemoglobin A1c was too high but it is now around 7 according to her.  No Known Allergies      Outpatient Encounter Medications as of 11/25/2019  Medication Sig  . polyethylene glycol powder (GLYCOLAX/MIRALAX) powder Take 17 g by mouth daily.   No facility-administered encounter medications on file as of 11/25/2019.         Past Medical History:  Diagnosis Date  . Anemia   . Carpal tunnel syndrome on left   . Chronic lower back pain          Past Surgical History:  Procedure Laterality Date  . ANTERIOR CERVICAL DECOMP/DISCECTOMY FUSION  07/2008   C4-5; C5-6  . CARPAL TUNNEL RELEASE  2010   left  . POSTERIOR FUSION LUMBAR SPINE  05/03/11   "& put in 3 screws"  . SHOULDER ARTHROSCOPY W/ ROTATOR CUFF REPAIR  11/2008   left  . SHOULDER OPEN ROTATOR CUFF REPAIR  02/2010   right  . TUBAL LIGATION  1980's  . VAGINAL HYSTERECTOMY  10/2008   w/partial right salpingectomy    No family history on file.  Social History      Social History Narrative  . Not on file  Denies tobacco use  Review of Systems General: Denies fevers, chills, weight loss CV: Denies chest pain, shortness of breath, palpitations  Physical  Exam Vitals with BMI 11/25/2019 05/12/2014 05/12/2014  Height 5\' 6"  - -  Weight 226 lbs 10 oz - -  BMI 29.93 - -  Systolic [phone]  Diastolic 84 73 73  Pulse 86 79 81    General:  No acute distress,  Alert and oriented, Non-Toxic, Normal speech and affect Breast: She has grade 3 ptosis.  Sternal notch to nipple is 33 cm bilaterally.  Nipple to fold is 16 cm on the right and 15 cm on the left.  She has a scar in the upper outer quadrant of the right breast from a previous cyst excision and she would like that to be excised at the same time.  Assessment/Plan The patient has bilateral symptomatic macromastia.  She is a good candidate for a breast reduction.  She is interested in pursuing surgical treatment.  She has tried supportive garments and fitted bras with no relief.  The details of breast reduction surgery were discussed.  I explained the procedure in detail along the with the expected scars.  The risks were discussed in detail and include bleeding, infection, damage to surrounding structures, need for additional procedures, nipple loss, change in nipple sensation, persistent pain, contour irregularities and asymmetries.  I explained that breast feeding is often not possible after breast  reduction surgery.  We discussed the expected postoperative course with an overall recovery period of about 1 month.  She demonstrated full understanding of all risks.  We discussed her personal risk factors that include excising the breast cyst at the same time.  I do not think this would fall within the typical skin pattern and may put her at slightly increased risk of wound healing complications.  I anticipate approximately 900 g of tissue removed from each side.  I believe the breast cyst and surrounding scar could be excised at the same time.

## 2020-04-17 ENCOUNTER — Other Ambulatory Visit: Payer: Self-pay

## 2020-04-17 ENCOUNTER — Ambulatory Visit (INDEPENDENT_AMBULATORY_CARE_PROVIDER_SITE_OTHER): Payer: 59 | Admitting: Surgical

## 2020-04-17 ENCOUNTER — Encounter: Payer: Self-pay | Admitting: Surgical

## 2020-04-17 VITALS — BP 134/83 | HR 86 | Ht 66.0 in | Wt 217.6 lb

## 2020-04-17 DIAGNOSIS — M4004 Postural kyphosis, thoracic region: Secondary | ICD-10-CM

## 2020-04-17 DIAGNOSIS — N62 Hypertrophy of breast: Secondary | ICD-10-CM

## 2020-04-17 DIAGNOSIS — M545 Low back pain, unspecified: Secondary | ICD-10-CM

## 2020-04-17 DIAGNOSIS — N6001 Solitary cyst of right breast: Secondary | ICD-10-CM

## 2020-04-17 DIAGNOSIS — M546 Pain in thoracic spine: Secondary | ICD-10-CM

## 2020-04-17 MED ORDER — ONDANSETRON HCL 4 MG PO TABS: 4.0000 mg | ORAL_TABLET | Freq: Three times a day (TID) | ORAL | 0 refills | Status: DC | PRN

## 2020-04-17 MED ORDER — HYDROCODONE-ACETAMINOPHEN 5-325 MG PO TABS: 1.0000 | ORAL_TABLET | Freq: Four times a day (QID) | ORAL | 0 refills | Status: AC | PRN

## 2020-04-17 NOTE — Progress Notes (Signed)
Patient ID: [name], female    DOB: April 22, 1965, 55 y.o.   MRN: [id number]  Chief Complaint  Patient presents with  . Pre-op Exam      ICD-10-CM   1. Macromastia  N62   2. Back pain of thoracolumbar region  M54.50    M54.6   3. Postural kyphosis, thoracic region  M40.04   4. Breast cyst, right  N60.01      History of Present Illness: [name] is a 55 y.o.  female  with a history of macromastia.  She presents for preoperative evaluation for upcoming procedure, Bilateral Breast Reduction and excision of right breast cyst, scheduled for 05/08/2020 with Dr.  Claudia Desanctis  The patient has not had problems with anesthesia. No history of DVT/PE.  No family history of DVT/PE.  No family or personal history of bleeding or clotting disorders.  Patient is not currently taking any blood thinners.  No history of CVA/MI.   Summary of Previous Visit: Patient is currently a double D and wants to be a C or D cup.  Her maternal aunt had breast cancer.  She does not smoke. Patient reports she would like to be smaller than expected, closer to a C cup than D cup.  Anticipate 900 g of tissue to be removed from each side.  PMH Significant for: Type 2 diabetes mellitus with recent A1c of 7.2 on 02/28/2020, hyperlipidemia, anemia.   Past Medical History: Allergies: No Known Allergies  Current Medications:  Current Outpatient Medications:  .  HYDROcodone-acetaminophen (NORCO) 5-325 MG tablet, Take 1 tablet by mouth every 6 (six) hours as needed for up to 5 days for severe pain., Disp: 20 tablet, Rfl: 0 .  ondansetron (ZOFRAN) 4 MG tablet, Take 1 tablet (4 mg total) by mouth every 8 (eight) hours as needed for nausea or vomiting., Disp: 20 tablet, Rfl: 0 .  metFORMIN (GLUCOPHAGE) 1000 MG tablet, Take 1,000 mg by mouth 2 (two) times daily., Disp: , Rfl:   Past Medical Problems: Past Medical History:  Diagnosis Date  . Anemia   . Carpal tunnel syndrome on left   . Chronic lower back pain      Past Surgical History: Past Surgical History:  Procedure Laterality Date  . ANTERIOR CERVICAL DECOMP/DISCECTOMY FUSION  07/2008   C4-5; C5-6  . CARPAL TUNNEL RELEASE  2010   left  . POSTERIOR FUSION LUMBAR SPINE  05/03/11   "& put in 3 screws"  . SHOULDER ARTHROSCOPY W/ ROTATOR CUFF REPAIR  11/2008   left  . SHOULDER OPEN ROTATOR CUFF REPAIR  02/2010   right  . TUBAL LIGATION  1980's  . VAGINAL HYSTERECTOMY  10/2008   w/partial right salpingectomy    Social History: Social History   Socioeconomic History  . Marital status: Single    Spouse name: Not on file  . Number of children: Not on file  . Years of education: Not on file  . Highest education level: Not on file  Occupational History  . Not on file  Tobacco Use  . Smoking status: Former Smoker    Packs/day: 2.00    Years: 11.00    Pack years: 22.00    Types: Cigarettes    Quit date: 02/18/1990    Years since quitting: 30.1  . Smokeless tobacco: Never Used  Substance and Sexual Activity  . Alcohol use: Yes    Comment: 05/03/11 "drink a little once or twice a year"  . Drug use: Yes  Types: Marijuana    Comment: "last marijuana ~ 1992"  . Sexual activity: Not Currently    Birth control/protection: Surgical  Other Topics Concern  . Not on file  Social History Narrative  . Not on file   Social Determinants of Health   Financial Resource Strain: Not on file  Food Insecurity: Not on file  Transportation Needs: Not on file  Physical Activity: Not on file  Stress: Not on file  Social Connections: Not on file  Intimate Partner Violence: Not on file    Family History: History reviewed. No pertinent family history.  Review of Systems: Review of Systems  Constitutional: Negative.   Respiratory: Negative.   Cardiovascular: Negative.   Gastrointestinal: Negative.   Neurological: Negative.     Physical Exam: Vital Signs BP 134/83 (BP Location: Left Arm, Patient Position: Sitting, Cuff Size: Large)    Pulse 86   Ht 5\' 6"  (1.676 m)   Wt 217 lb 9.6 oz (98.7 kg)   SpO2 97%   BMI 35.12 kg/m   Physical Exam  Constitutional:      General: Not in acute distress.    Appearance: Normal appearance. Not ill-appearing.  HENT:     Head: Normocephalic and atraumatic.  Eyes:     Pupils: Pupils are equal, round Neck:     Musculoskeletal: Normal range of motion.  Cardiovascular:     Rate and Rhythm: Normal rate and regular rhythm.     Pulses: Normal pulses.     Heart sounds: Normal heart sounds. No murmur.  Pulmonary:     Effort: Pulmonary effort is normal. No respiratory distress.     Breath sounds: Normal breath sounds. No wheezing.  Abdominal:     General: Abdomen is flat. There is no distension.     Palpations: Abdomen is soft.     Tenderness: There is no abdominal tenderness.  Musculoskeletal: Normal range of motion.  Skin:    General: Skin is warm and dry.     Findings: No erythema or rash.  Neurological:     General: No focal deficit present.     Mental Status: Alert and oriented to person, place, and time. Mental status is at baseline.     Motor: No weakness.  Psychiatric:        Mood and Affect: Mood normal.        Behavior: Behavior normal.    Assessment/Plan: The patient is scheduled for bilateral breast reduction and excision of right breast cyst with Dr. Claudia Desanctis.  Risks, benefits, and alternatives of procedure discussed, questions answered and consent obtained.    Smoking Status: Non-smoker; Counseling Given?  N/A Last Mammogram: 11/08/2019 result: Negative Patient also had an ultrasound of her right breast on 08/12/2019 which showed a cyst that appeared benign.  Caprini Score: 4, moderate; Risk Factors include: Age, BMI greater than 25, and length of planned surgery. Recommendation for mechanical and pharmacological prophylaxis while hospitalized. Encourage early ambulation.   Pictures obtained: at consult  Post-op Rx sent to pharmacy: norco, zofran  Patient was  provided with the breast reduction and General Surgical Risk consent document and Pain Medication Agreement prior to their appointment.  They had adequate time to read through the risk consent documents and Pain Medication Agreement. We also discussed them in person together during this preop appointment. All of their questions were answered to their satisfaction.  Recommended calling if they have any further questions.  Risk consent form and Pain Medication Agreement to be scanned into patient's chart.  The risk that can be encountered with breast reduction were discussed and include the following but not limited to these:  Breast asymmetry, fluid accumulation, firmness of the breast, inability to breast feed, loss of nipple or areola, skin loss, decrease or no nipple sensation, fat necrosis of the breast tissue, bleeding, infection, healing delay.  There are risks of anesthesia, changes to skin sensation and injury to nerves or blood vessels.  The muscle can be temporarily or permanently injured.  You may have an allergic reaction to tape, suture, glue, blood products which can result in skin discoloration, swelling, pain, skin lesions, poor healing.  Any of these can lead to the need for revisonal surgery or stage procedures.  A reduction has potential to interfere with diagnostic procedures.  Nipple or breast piercing can increase risks of infection.  This procedure is best done when the breast is fully developed.  Changes in the breast will continue to occur over time.  Pregnancy can alter the outcomes of previous breast reduction surgery, weight gain and weigh loss can also effect the long term appearance.     Electronically signed by: Carola Rhine Scheeler, PA-C 04/17/2020 10:58 AM

## 2020-04-17 NOTE — H&P (View-Only) (Signed)
Patient ID: [name], female    DOB: 03-01-1965, 55 y.o.   MRN: [id number]  Chief Complaint  Patient presents with   Pre-op Exam      ICD-10-CM   1. Macromastia  N62   2. Back pain of thoracolumbar region  M54.50    M54.6   3. Postural kyphosis, thoracic region  M40.04   4. Breast cyst, right  N60.01      History of Present Illness: [name] is a 55 y.o.  female  with a history of macromastia.  She presents for preoperative evaluation for upcoming procedure, Bilateral Breast Reduction and excision of right breast cyst, scheduled for 05/08/2020 with Dr.  Claudia Desanctis  The patient has not had problems with anesthesia. No history of DVT/PE.  No family history of DVT/PE.  No family or personal history of bleeding or clotting disorders.  Patient is not currently taking any blood thinners.  No history of CVA/MI.   Summary of Previous Visit: Patient is currently a double D and wants to be a C or D cup.  Her maternal aunt had breast cancer.  She does not smoke. Patient reports she would like to be smaller than expected, closer to a C cup than D cup.  Anticipate 900 g of tissue to be removed from each side.  PMH Significant for: Type 2 diabetes mellitus with recent A1c of 7.2 on 02/28/2020, hyperlipidemia, anemia.   Past Medical History: Allergies: No Known Allergies  Current Medications:  Current Outpatient Medications:    HYDROcodone-acetaminophen (NORCO) 5-325 MG tablet, Take 1 tablet by mouth every 6 (six) hours as needed for up to 5 days for severe pain., Disp: 20 tablet, Rfl: 0   ondansetron (ZOFRAN) 4 MG tablet, Take 1 tablet (4 mg total) by mouth every 8 (eight) hours as needed for nausea or vomiting., Disp: 20 tablet, Rfl: 0   metFORMIN (GLUCOPHAGE) 1000 MG tablet, Take 1,000 mg by mouth 2 (two) times daily., Disp: , Rfl:   Past Medical Problems: Past Medical History:  Diagnosis Date   Anemia    Carpal tunnel syndrome on left    Chronic lower back pain      Past Surgical History: Past Surgical History:  Procedure Laterality Date   ANTERIOR CERVICAL DECOMP/DISCECTOMY FUSION  07/2008   C4-5; C5-6   CARPAL TUNNEL RELEASE  2010   left   POSTERIOR FUSION LUMBAR SPINE  05/03/11   "& put in 3 screws"   SHOULDER ARTHROSCOPY W/ ROTATOR CUFF REPAIR  11/2008   left   SHOULDER OPEN ROTATOR CUFF REPAIR  02/2010   right   TUBAL LIGATION  1980's   VAGINAL HYSTERECTOMY  10/2008   w/partial right salpingectomy    Social History: Social History   Socioeconomic History   Marital status: Single    Spouse name: Not on file   Number of children: Not on file   Years of education: Not on file   Highest education level: Not on file  Occupational History   Not on file  Tobacco Use   Smoking status: Former Smoker    Packs/day: 2.00    Years: 11.00    Pack years: 22.00    Types: Cigarettes    Quit date: 02/18/1990    Years since quitting: 30.1   Smokeless tobacco: Never Used  Substance and Sexual Activity   Alcohol use: Yes    Comment: 05/03/11 "drink a little once or twice a year"   Drug use: Yes  Types: Marijuana    Comment: "last marijuana ~ 1992"   Sexual activity: Not Currently    Birth control/protection: Surgical  Other Topics Concern   Not on file  Social History Narrative   Not on file   Social Determinants of Health   Financial Resource Strain: Not on file  Food Insecurity: Not on file  Transportation Needs: Not on file  Physical Activity: Not on file  Stress: Not on file  Social Connections: Not on file  Intimate Partner Violence: Not on file    Family History: History reviewed. No pertinent family history.  Review of Systems: Review of Systems  Constitutional: Negative.   Respiratory: Negative.   Cardiovascular: Negative.   Gastrointestinal: Negative.   Neurological: Negative.     Physical Exam: Vital Signs BP 134/83 (BP Location: Left Arm, Patient Position: Sitting, Cuff Size: Large)     Pulse 86    Ht 5\' 6"  (1.676 m)    Wt 217 lb 9.6 oz (98.7 kg)    SpO2 97%    BMI 35.12 kg/m   Physical Exam  Constitutional:      General: Not in acute distress.    Appearance: Normal appearance. Not ill-appearing.  HENT:     Head: Normocephalic and atraumatic.  Eyes:     Pupils: Pupils are equal, round Neck:     Musculoskeletal: Normal range of motion.  Cardiovascular:     Rate and Rhythm: Normal rate and regular rhythm.     Pulses: Normal pulses.     Heart sounds: Normal heart sounds. No murmur.  Pulmonary:     Effort: Pulmonary effort is normal. No respiratory distress.     Breath sounds: Normal breath sounds. No wheezing.  Abdominal:     General: Abdomen is flat. There is no distension.     Palpations: Abdomen is soft.     Tenderness: There is no abdominal tenderness.  Musculoskeletal: Normal range of motion.  Skin:    General: Skin is warm and dry.     Findings: No erythema or rash.  Neurological:     General: No focal deficit present.     Mental Status: Alert and oriented to person, place, and time. Mental status is at baseline.     Motor: No weakness.  Psychiatric:        Mood and Affect: Mood normal.        Behavior: Behavior normal.    Assessment/Plan: The patient is scheduled for bilateral breast reduction and excision of right breast cyst with Dr. Claudia Desanctis.  Risks, benefits, and alternatives of procedure discussed, questions answered and consent obtained.    Smoking Status: Non-smoker; Counseling Given?  N/A Last Mammogram: 11/08/2019 result: Negative Patient also had an ultrasound of her right breast on 08/12/2019 which showed a cyst that appeared benign.  Caprini Score: 4, moderate; Risk Factors include: Age, BMI greater than 25, and length of planned surgery. Recommendation for mechanical and pharmacological prophylaxis while hospitalized. Encourage early ambulation.   Pictures obtained: at consult  Post-op Rx sent to pharmacy: norco, zofran  Patient was  provided with the breast reduction and General Surgical Risk consent document and Pain Medication Agreement prior to their appointment.  They had adequate time to read through the risk consent documents and Pain Medication Agreement. We also discussed them in person together during this preop appointment. All of their questions were answered to their satisfaction.  Recommended calling if they have any further questions.  Risk consent form and Pain Medication Agreement to  be scanned into patient's chart.  The risk that can be encountered with breast reduction were discussed and include the following but not limited to these:  Breast asymmetry, fluid accumulation, firmness of the breast, inability to breast feed, loss of nipple or areola, skin loss, decrease or no nipple sensation, fat necrosis of the breast tissue, bleeding, infection, healing delay.  There are risks of anesthesia, changes to skin sensation and injury to nerves or blood vessels.  The muscle can be temporarily or permanently injured.  You may have an allergic reaction to tape, suture, glue, blood products which can result in skin discoloration, swelling, pain, skin lesions, poor healing.  Any of these can lead to the need for revisonal surgery or stage procedures.  A reduction has potential to interfere with diagnostic procedures.  Nipple or breast piercing can increase risks of infection.  This procedure is best done when the breast is fully developed.  Changes in the breast will continue to occur over time.  Pregnancy can alter the outcomes of previous breast reduction surgery, weight gain and weigh loss can also effect the long term appearance.     Electronically signed by: Carola Rhine Sayla Golonka, PA-C 04/17/2020 10:58 AM

## 2020-05-01 ENCOUNTER — Other Ambulatory Visit: Payer: Self-pay

## 2020-05-01 ENCOUNTER — Encounter (HOSPITAL_BASED_OUTPATIENT_CLINIC_OR_DEPARTMENT_OTHER): Payer: Self-pay | Admitting: Plastic Surgery

## 2020-05-05 ENCOUNTER — Other Ambulatory Visit (HOSPITAL_COMMUNITY)
Admission: RE | Admit: 2020-05-05 | Discharge: 2020-05-05 | Disposition: A | Discharge status: Home / Self Care | Payer: 59 | Source: Ambulatory Visit | Attending: Plastic Surgery | Admitting: Plastic Surgery

## 2020-05-05 ENCOUNTER — Other Ambulatory Visit: Payer: Self-pay

## 2020-05-05 ENCOUNTER — Encounter (HOSPITAL_BASED_OUTPATIENT_CLINIC_OR_DEPARTMENT_OTHER)
Admission: RE | Admit: 2020-05-05 | Discharge: 2020-05-05 | Disposition: A | Discharge status: Home / Self Care | Payer: 59 | Source: Ambulatory Visit | Attending: Plastic Surgery | Admitting: Plastic Surgery

## 2020-05-05 DIAGNOSIS — Z01812 Encounter for preprocedural laboratory examination: Secondary | ICD-10-CM | POA: Diagnosis present

## 2020-05-05 DIAGNOSIS — Z20822 Contact with and (suspected) exposure to covid-19: Secondary | ICD-10-CM | POA: Diagnosis not present

## 2020-05-05 LAB — BASIC METABOLIC PANEL
Anion gap: 10 (ref 5–15)
BUN: 13 mg/dL (ref 6–20)
CO2: 26 mmol/L (ref 22–32)
Calcium: 9.8 mg/dL (ref 8.9–10.3)
Chloride: 104 mmol/L (ref 98–111)
Creatinine, Ser: 0.8 mg/dL (ref 0.44–1.00)
GFR, Estimated: >60 mL/min (ref >60)
Glucose, Bld: 207 mg/dL — ABNORMAL HIGH (ref 70–99)
Potassium: 3.5 mmol/L (ref 3.5–5.1)
Sodium: 140 mmol/L (ref 135–145)

## 2020-05-05 NOTE — Progress Notes (Signed)
Blood sugar reviewed by Dr. Gloris Manchester and will proceed with surgery as scheduled. Recheck blood sugar am of surgery

## 2020-05-06 LAB — SARS CORONAVIRUS 2 (TAT 6-24 HRS): SARS Coronavirus 2: NEGATIVE

## 2020-05-08 ENCOUNTER — Encounter (HOSPITAL_BASED_OUTPATIENT_CLINIC_OR_DEPARTMENT_OTHER)
Admission: RE | Disposition: A | Discharge status: Home / Self Care | Payer: Self-pay | Source: Home / Self Care | Attending: Plastic Surgery

## 2020-05-08 ENCOUNTER — Ambulatory Visit (HOSPITAL_BASED_OUTPATIENT_CLINIC_OR_DEPARTMENT_OTHER)
Admission: RE | Admit: 2020-05-08 | Discharge: 2020-05-08 | Disposition: A | Discharge status: Home / Self Care | Payer: 59 | Attending: Plastic Surgery | Admitting: Plastic Surgery

## 2020-05-08 ENCOUNTER — Ambulatory Visit (HOSPITAL_BASED_OUTPATIENT_CLINIC_OR_DEPARTMENT_OTHER): Payer: 59 | Admitting: Anesthesiology

## 2020-05-08 ENCOUNTER — Other Ambulatory Visit: Payer: Self-pay

## 2020-05-08 ENCOUNTER — Encounter (HOSPITAL_BASED_OUTPATIENT_CLINIC_OR_DEPARTMENT_OTHER): Payer: Self-pay | Admitting: Plastic Surgery

## 2020-05-08 DIAGNOSIS — N62 Hypertrophy of breast: Secondary | ICD-10-CM | POA: Insufficient documentation

## 2020-05-08 DIAGNOSIS — E119 Type 2 diabetes mellitus without complications: Secondary | ICD-10-CM | POA: Insufficient documentation

## 2020-05-08 DIAGNOSIS — Z79899 Other long term (current) drug therapy: Secondary | ICD-10-CM | POA: Diagnosis not present

## 2020-05-08 DIAGNOSIS — L720 Epidermal cyst: Secondary | ICD-10-CM | POA: Diagnosis not present

## 2020-05-08 DIAGNOSIS — Z803 Family history of malignant neoplasm of breast: Secondary | ICD-10-CM | POA: Diagnosis not present

## 2020-05-08 DIAGNOSIS — M549 Dorsalgia, unspecified: Secondary | ICD-10-CM | POA: Insufficient documentation

## 2020-05-08 DIAGNOSIS — N6001 Solitary cyst of right breast: Secondary | ICD-10-CM

## 2020-05-08 DIAGNOSIS — Z87891 Personal history of nicotine dependence: Secondary | ICD-10-CM | POA: Diagnosis not present

## 2020-05-08 DIAGNOSIS — D0501 Lobular carcinoma in situ of right breast: Secondary | ICD-10-CM | POA: Insufficient documentation

## 2020-05-08 DIAGNOSIS — Z7984 Long term (current) use of oral hypoglycemic drugs: Secondary | ICD-10-CM | POA: Insufficient documentation

## 2020-05-08 DIAGNOSIS — D241 Benign neoplasm of right breast: Secondary | ICD-10-CM | POA: Diagnosis not present

## 2020-05-08 HISTORY — PX: BREAST REDUCTION SURGERY: SHX8

## 2020-05-08 HISTORY — DX: Type 2 diabetes mellitus without complications: E11.9

## 2020-05-08 HISTORY — PX: BREAST CYST EXCISION: SHX579

## 2020-05-08 LAB — GLUCOSE, CAPILLARY
Glucose-Capillary: 136 mg/dL — ABNORMAL HIGH (ref 70–99)
Glucose-Capillary: 185 mg/dL — ABNORMAL HIGH (ref 70–99)

## 2020-05-08 SURGERY — MAMMOPLASTY, REDUCTION
Anesthesia: General | Site: Breast | Laterality: Right

## 2020-05-08 MED ORDER — FENTANYL CITRATE (PF) 100 MCG/2ML IJ SOLN
INTRAMUSCULAR | Status: AC
  Filled 2020-05-08: qty 2

## 2020-05-08 MED ORDER — FENTANYL CITRATE (PF) 100 MCG/2ML IJ SOLN
INTRAMUSCULAR | Status: DC | PRN
  Administered 2020-05-08 (×3): 50 ug via INTRAVENOUS
  Administered 2020-05-08: 100 ug via INTRAVENOUS

## 2020-05-08 MED ORDER — OXYCODONE HCL 5 MG/5ML PO SOLN: 5.0000 mg | Freq: Once | ORAL | Status: DC | PRN

## 2020-05-08 MED ORDER — MIDAZOLAM HCL 2 MG/2ML IJ SOLN
INTRAMUSCULAR | Status: AC
  Filled 2020-05-08: qty 2

## 2020-05-08 MED ORDER — MIDAZOLAM HCL 5 MG/5ML IJ SOLN
INTRAMUSCULAR | Status: DC | PRN
  Administered 2020-05-08: 2 mg via INTRAVENOUS

## 2020-05-08 MED ORDER — PROPOFOL 500 MG/50ML IV EMUL
INTRAVENOUS | Status: DC | PRN
  Administered 2020-05-08: 25 ug/kg/min via INTRAVENOUS

## 2020-05-08 MED ORDER — EPINEPHRINE PF 1 MG/ML IJ SOLN
INTRAMUSCULAR | Status: AC
  Filled 2020-05-08: qty 1

## 2020-05-08 MED ORDER — HYDROMORPHONE HCL 1 MG/ML IJ SOLN
INTRAMUSCULAR | Status: AC
  Filled 2020-05-08: qty 0.5

## 2020-05-08 MED ORDER — SUGAMMADEX SODIUM 200 MG/2ML IV SOLN
INTRAVENOUS | Status: DC | PRN
  Administered 2020-05-08: 200 mg via INTRAVENOUS

## 2020-05-08 MED ORDER — LACTATED RINGERS IV SOLN
INTRAVENOUS | Status: DC | PRN
  Administered 2020-05-08: 1600 mL

## 2020-05-08 MED ORDER — PHENYLEPHRINE HCL (PRESSORS) 10 MG/ML IV SOLN
INTRAVENOUS | Status: DC | PRN
  Administered 2020-05-08: 80 ug via INTRAVENOUS

## 2020-05-08 MED ORDER — LACTATED RINGERS IV SOLN: INTRAVENOUS | Status: DC

## 2020-05-08 MED ORDER — LIDOCAINE-EPINEPHRINE 1 %-1:100000 IJ SOLN
INTRAMUSCULAR | Status: AC
  Filled 2020-05-08: qty 1

## 2020-05-08 MED ORDER — BACITRACIN ZINC 500 UNIT/GM EX OINT
TOPICAL_OINTMENT | CUTANEOUS | Status: AC
  Filled 2020-05-08: qty 0.9

## 2020-05-08 MED ORDER — AMISULPRIDE (ANTIEMETIC) 5 MG/2ML IV SOLN: 10.0000 mg | Freq: Once | INTRAVENOUS | Status: DC | PRN

## 2020-05-08 MED ORDER — DEXAMETHASONE SODIUM PHOSPHATE 4 MG/ML IJ SOLN
INTRAMUSCULAR | Status: DC | PRN
  Administered 2020-05-08: 4 mg via INTRAVENOUS

## 2020-05-08 MED ORDER — DROPERIDOL 2.5 MG/ML IJ SOLN
INTRAMUSCULAR | Status: DC | PRN
  Administered 2020-05-08: .625 mg via INTRAVENOUS

## 2020-05-08 MED ORDER — LIDOCAINE HCL (CARDIAC) PF 100 MG/5ML IV SOSY
PREFILLED_SYRINGE | INTRAVENOUS | Status: DC | PRN
  Administered 2020-05-08: 60 mg via INTRAVENOUS

## 2020-05-08 MED ORDER — BUPIVACAINE HCL (PF) 0.25 % IJ SOLN
INTRAMUSCULAR | Status: AC
  Filled 2020-05-08: qty 30

## 2020-05-08 MED ORDER — BUPIVACAINE HCL (PF) 0.25 % IJ SOLN
INTRAMUSCULAR | Status: AC
Start: 2020-05-08 — End: ?
  Filled 2020-05-08: qty 30

## 2020-05-08 MED ORDER — MEPERIDINE HCL 25 MG/ML IJ SOLN
6.2500 mg | INTRAMUSCULAR | Status: DC | PRN
Start: 2020-05-08 — End: 2020-05-08

## 2020-05-08 MED ORDER — ONDANSETRON HCL 4 MG/2ML IJ SOLN
INTRAMUSCULAR | Status: AC
  Filled 2020-05-08: qty 16

## 2020-05-08 MED ORDER — CEFAZOLIN SODIUM-DEXTROSE 2-4 GM/100ML-% IV SOLN
2.0000 g | INTRAVENOUS | Status: AC
  Administered 2020-05-08: 2 g via INTRAVENOUS

## 2020-05-08 MED ORDER — PROPOFOL 500 MG/50ML IV EMUL
INTRAVENOUS | Status: AC
  Filled 2020-05-08: qty 150

## 2020-05-08 MED ORDER — ROCURONIUM BROMIDE 10 MG/ML (PF) SYRINGE
PREFILLED_SYRINGE | INTRAVENOUS | Status: AC
  Filled 2020-05-08: qty 10

## 2020-05-08 MED ORDER — ONDANSETRON HCL 4 MG/2ML IJ SOLN
INTRAMUSCULAR | Status: DC | PRN
  Administered 2020-05-08: 4 mg via INTRAVENOUS

## 2020-05-08 MED ORDER — PHENYLEPHRINE 40 MCG/ML (10ML) SYRINGE FOR IV PUSH (FOR BLOOD PRESSURE SUPPORT)
PREFILLED_SYRINGE | INTRAVENOUS | Status: AC
  Filled 2020-05-08: qty 10

## 2020-05-08 MED ORDER — ROCURONIUM BROMIDE 100 MG/10ML IV SOLN
INTRAVENOUS | Status: DC | PRN
  Administered 2020-05-08: 80 mg via INTRAVENOUS

## 2020-05-08 MED ORDER — MISOPROSTOL 200 MCG PO TABS
ORAL_TABLET | ORAL | Status: AC
  Filled 2020-05-08: qty 5

## 2020-05-08 MED ORDER — PROPOFOL 10 MG/ML IV BOLUS
INTRAVENOUS | Status: DC | PRN
  Administered 2020-05-08: 200 mg via INTRAVENOUS

## 2020-05-08 MED ORDER — DEXAMETHASONE SODIUM PHOSPHATE 10 MG/ML IJ SOLN
INTRAMUSCULAR | Status: AC
  Filled 2020-05-08: qty 2

## 2020-05-08 MED ORDER — LIDOCAINE 2% (20 MG/ML) 5 ML SYRINGE
INTRAMUSCULAR | Status: AC
  Filled 2020-05-08: qty 30

## 2020-05-08 MED ORDER — HYDROMORPHONE HCL 1 MG/ML IJ SOLN
0.2500 mg | INTRAMUSCULAR | Status: DC | PRN
  Administered 2020-05-08: 0.5 mg via INTRAVENOUS

## 2020-05-08 MED ORDER — CHLOROPROCAINE HCL 1 % IJ SOLN
INTRAMUSCULAR | Status: AC
  Filled 2020-05-08: qty 30

## 2020-05-08 MED ORDER — OXYTOCIN 10 UNIT/ML IJ SOLN
INTRAMUSCULAR | Status: AC
  Filled 2020-05-08: qty 1

## 2020-05-08 MED ORDER — CEFAZOLIN SODIUM-DEXTROSE 2-4 GM/100ML-% IV SOLN
INTRAVENOUS | Status: AC
  Filled 2020-05-08: qty 100

## 2020-05-08 MED ORDER — EPHEDRINE 5 MG/ML INJ
INTRAVENOUS | Status: AC
  Filled 2020-05-08: qty 10

## 2020-05-08 MED ORDER — PROMETHAZINE HCL 25 MG/ML IJ SOLN: 6.2500 mg | INTRAMUSCULAR | Status: DC | PRN

## 2020-05-08 MED ORDER — OXYCODONE HCL 5 MG PO TABS: 5.0000 mg | ORAL_TABLET | Freq: Once | ORAL | Status: DC | PRN

## 2020-05-08 SURGICAL SUPPLY — 51 items
APL PRP STRL LF DISP 70% ISPRP (MISCELLANEOUS) ×4
APL SKNCLS STERI-STRIP NONHPOA (GAUZE/BANDAGES/DRESSINGS) ×4
BENZOIN TINCTURE PRP APPL 2/3 (GAUZE/BANDAGES/DRESSINGS) ×6 IMPLANT
BLADE SURG 10 STRL SS (BLADE) ×9 IMPLANT
BLADE SURG 15 STRL LF DISP TIS (BLADE) ×1 IMPLANT
BLADE SURG 15 STRL SS (BLADE) ×3
BNDG ELASTIC 6X5.8 VLCR STR LF (GAUZE/BANDAGES/DRESSINGS) ×6 IMPLANT
CANISTER SUCT 1200ML W/VALVE (MISCELLANEOUS) ×3 IMPLANT
CHLORAPREP W/TINT 26 (MISCELLANEOUS) ×6 IMPLANT
COVER BACK TABLE 60X90IN (DRAPES) ×3 IMPLANT
COVER MAYO STAND STRL (DRAPES) ×3 IMPLANT
DRAPE LAPAROSCOPIC ABDOMINAL (DRAPES) ×3 IMPLANT
DRAPE UTILITY XL STRL (DRAPES) ×3 IMPLANT
DRSG PAD ABDOMINAL 8X10 ST (GAUZE/BANDAGES/DRESSINGS) ×8 IMPLANT
ELECT REM PT RETURN 9FT ADLT (ELECTROSURGICAL) ×3
ELECTRODE REM PT RTRN 9FT ADLT (ELECTROSURGICAL) ×2 IMPLANT
GAUZE SPONGE 4X4 12PLY STRL (GAUZE/BANDAGES/DRESSINGS) ×6 IMPLANT
GLOVE SRG 8 PF TXTR STRL LF DI (GLOVE) ×1 IMPLANT
GLOVE SURG ENC MOIS LTX SZ6.5 (GLOVE) ×6 IMPLANT
GLOVE SURG ENC MOIS LTX SZ7 (GLOVE) ×1 IMPLANT
GLOVE SURG ENC MOIS LTX SZ7.5 (GLOVE) ×2 IMPLANT
GLOVE SURG ENC TEXT LTX SZ7.5 (GLOVE) ×2 IMPLANT
GLOVE SURG UNDER POLY LF SZ7 (GLOVE) ×3 IMPLANT
GLOVE SURG UNDER POLY LF SZ7.5 (GLOVE) ×1 IMPLANT
GLOVE SURG UNDER POLY LF SZ8 (GLOVE) ×3
GOWN STRL REUS W/ TWL LRG LVL3 (GOWN DISPOSABLE) ×7 IMPLANT
GOWN STRL REUS W/TWL LRG LVL3 (GOWN DISPOSABLE) ×12
NDL FILTER BLUNT 18X1 1/2 (NEEDLE) ×1 IMPLANT
NDL SPNL 18GX3.5 QUINCKE PK (NEEDLE) ×2 IMPLANT
NEEDLE FILTER BLUNT 18X 1/2SAF (NEEDLE) ×1
NEEDLE FILTER BLUNT 18X1 1/2 (NEEDLE) ×2 IMPLANT
NEEDLE SPNL 18GX3.5 QUINCKE PK (NEEDLE) ×3 IMPLANT
NS IRRIG 1000ML POUR BTL (IV SOLUTION) ×3 IMPLANT
PACK BASIN DAY SURGERY FS (CUSTOM PROCEDURE TRAY) ×3 IMPLANT
PENCIL SMOKE EVACUATOR (MISCELLANEOUS) ×3 IMPLANT
SHEET MEDIUM DRAPE 40X70 STRL (DRAPES) ×2 IMPLANT
SLEEVE SCD COMPRESS KNEE MED (STOCKING) ×3 IMPLANT
SPONGE LAP 18X18 RF (DISPOSABLE) ×10 IMPLANT
STAPLER INSORB 30 2030 C-SECTI (MISCELLANEOUS) ×5 IMPLANT
STAPLER VISISTAT 35W (STAPLE) ×3 IMPLANT
STRIP SUTURE WOUND CLOSURE 1/2 (MISCELLANEOUS) ×9 IMPLANT
SUT MNCRL AB 4-0 PS2 18 (SUTURE) ×7 IMPLANT
SUT PDS 3-0 CT2 (SUTURE) ×6
SUT PDS II 3-0 CT2 27 ABS (SUTURE) ×4 IMPLANT
SUT VLOC 180 P-14 24 (SUTURE) ×6 IMPLANT
SYR BULB IRRIG 60ML STRL (SYRINGE) ×3 IMPLANT
TOWEL GREEN STERILE FF (TOWEL DISPOSABLE) ×6 IMPLANT
TUBE CONNECTING 20X1/4 (TUBING) ×3 IMPLANT
TUBING INFILTRATION IT-10001 (TUBING) ×3 IMPLANT
UNDERPAD 30X36 HEAVY ABSORB (UNDERPADS AND DIAPERS) ×6 IMPLANT
YANKAUER SUCT BULB TIP NO VENT (SUCTIONS) ×3 IMPLANT

## 2020-05-08 NOTE — Anesthesia Procedure Notes (Signed)
Procedure Name: Intubation Date/Time: 05/08/2020 9:55 AM Performed by: Signe Colt, CRNA Pre-anesthesia Checklist: Patient identified, Emergency Drugs available, Suction available and Patient being monitored Patient Re-evaluated:Patient Re-evaluated prior to induction Oxygen Delivery Method: Circle system utilized Preoxygenation: Pre-oxygenation with 100% oxygen Induction Type: IV induction Ventilation: Mask ventilation without difficulty Laryngoscope Size: Mac and 3 Grade View: Grade I Tube type: Oral Tube size: 7.0 mm Number of attempts: 1 Airway Equipment and Method: Stylet and Oral airway Placement Confirmation: ETT inserted through vocal cords under direct vision,  positive ETCO2 and breath sounds checked- equal and bilateral Secured at: 22 cm Tube secured with: Tape Dental Injury: Teeth and Oropharynx as per pre-operative assessment

## 2020-05-08 NOTE — Op Note (Signed)
Operative Note   DATE OF OPERATION: 05/08/2020  LOCATION: [facility]   SURGICAL DEPARTMENT: Plastic Surgery  PREOPERATIVE DIAGNOSES:  1. Bilateral symptomatic macromastia. 2. Right breast cyst  POSTOPERATIVE DIAGNOSES:  same  PROCEDURE:  1. Bilateral breast reduction with superomedial pedicle. 2.  4 cm right breast cyst excision with 4 cm complex closure  SURGEON: Talmadge Coventry, MD  ASSISTANT: [place], RNFA The advanced practice practitioner (APP) assisted throughout the case.  The APP was essential in retraction and counter traction when needed to make the case progress smoothly.  This retraction and assistance made it possible to see the tissue plans for the procedure.  The assistance was needed for blood control, tissue re-approximation and assisted with closure of the incision site.  ANESTHESIA: General.  COMPLICATIONS: None.   INDICATIONS FOR PROCEDURE:  The patient, [name] is a 55 y.o. female born on 10/13/1965, is here for treatment of bilateral symptomatic macromastia. MRN: [id number]  CONSENT:  Informed consent was obtained directly from the patient. Risks, benefits and alternatives were fully discussed. Specific risks including but not limited to bleeding, infection, hematoma, seroma, scarring, pain, infection, contracture, asymmetry, wound healing problems, and need for further surgery were all discussed. The patient did have an ample opportunity to have questions answered to satisfaction.   DESCRIPTION OF PROCEDURE:  The patient was marked preoperatively for a Wise pattern skin excision.  The patient was taken to the operating room. SCDs were placed and antibiotics were given. General anesthesia was administered.The patient's operative site was prepped and draped in a sterile fashion. A time out was performed and all information was confirmed to be correct.  I started by excising the right breast cyst.  This was in the lateral aspect of  the breast.  I planned out an elliptical excision of the cyst and surrounding scar.  The area was infiltrated with tumescent solution.  The excision was performed with a 10 blade and specimen passed off.  Undermining was then performed circumferentially and the skin was advanced and closed in layers with interrupted buried Enzor staples and a running 3 oh V-Loc.  The excision was about 4 cm in size as was the closure.  Right Breast: The breast was infiltrated with tumescent solution to help with hemostasis.  The nipple was marked with a cookie cutter.  A superomedial pedicle was drawn out with the base of at least 8 cm in size.  A breast tourniquet was then applied and the pedicle was de-epithelialized.  Breast tourniquet was then let down and all incisions were made with a 10 blade.  The pedicle was then isolated down to the chest wall with cautery and the excision was performed removing tissue primarily inferiorly and laterally.  Hemostasis was obtained and the wound was stapled closed.  Left breast:  The breast was infiltrated with tumescent solution to help with hemostasis.  The nipple was marked with a cookie cutter.  A superomedial pedicle was drawn out with the base of at least 8 cm in size.  A breast tourniquet was then applied and the pedicle was de-epithelialized.  Breast tourniquet was then let down and all incisions were made with a 10 blade.  The pedicle was then isolated down to the chest wall with cautery and the excision was performed removing tissue primarily inferiorly and laterally.  Hemostasis was obtained and the wound was stapled closed.  Patient was then set up to check for size and symmetry.  Minor modifications were made.  This resulted in a total of 945g removed from the right side and 910g removed from the left side.  The inframammary incision was closed with a combination of buried in-sorb staples and a running 3-0 Quill suture.  The vertical and periareolar limbs were closed with  interrupted buried 4-0 Monocryl and a running 4-0 Quill suture.  Steri-Strips were then applied along with a soft dressing and Ace wrap.  The patient tolerated the procedure well.  There were no complications. The patient was allowed to wake from anesthesia, extubated and taken to the recovery room in satisfactory condition.  I was present for the entire procedure.

## 2020-05-08 NOTE — Anesthesia Preprocedure Evaluation (Signed)
Anesthesia Evaluation  Patient identified by MRN, date of birth, ID band Patient awake    Reviewed: Allergy & Precautions, H&P , NPO status , Patient's Chart, lab work & pertinent test results  Airway Mallampati: I  TM Distance: >3 FB Neck ROM: Full    Dental  (+) Teeth Intact, Dental Advisory Given   Pulmonary former smoker,    breath sounds clear to auscultation       Cardiovascular  Rhythm:Regular Rate:Normal     Neuro/Psych    GI/Hepatic GERD  Controlled,  Endo/Other  diabetes, Type 2  Renal/GU      Musculoskeletal   Abdominal (+) + obese,   Peds  Hematology   Anesthesia Other Findings   Reproductive/Obstetrics                             Anesthesia Physical  Anesthesia Plan  ASA: III  Anesthesia Plan: General   Post-op Pain Management:    Induction: Intravenous  PONV Risk Score and Plan: 3 and Ondansetron, Dexamethasone, Midazolam and Treatment may vary due to age or medical condition  Airway Management Planned: LMA and Oral ETT  Additional Equipment:   Intra-op Plan:   Post-operative Plan: Extubation in OR  Informed Consent: I have reviewed the patients History and Physical, chart, labs and discussed the procedure including the risks, benefits and alternatives for the proposed anesthesia with the patient or authorized representative who has indicated his/her understanding and acceptance.     Dental advisory given  Plan Discussed with: CRNA, Anesthesiologist and Surgeon  Anesthesia Plan Comments:         Anesthesia Quick Evaluation

## 2020-05-08 NOTE — Anesthesia Postprocedure Evaluation (Signed)
Anesthesia Post Note  Patient: [name]  Procedure(s) Performed: MAMMARY REDUCTION  (BREAST) (Bilateral Breast) CYST EXCISION BREAST (Right Breast)     Patient location during evaluation: PACU Anesthesia Type: General Level of consciousness: awake and alert Pain management: pain level controlled Vital Signs Assessment: post-procedure vital signs reviewed and stable Respiratory status: spontaneous breathing, nonlabored ventilation and respiratory function stable Cardiovascular status: blood pressure returned to baseline and stable Postop Assessment: no apparent nausea or vomiting Anesthetic complications: no   No complications documented.  Last Vitals:  Vitals:   05/08/20 1300 05/08/20 1315  BP: (!) 144/80 (!) 142/88  Pulse: 82 87  Resp: 16 16  Temp:    SpO2: 100% 98%    Last Pain:  Vitals:   05/08/20 1300  TempSrc:   PainSc: [place]

## 2020-05-08 NOTE — Discharge Instructions (Signed)
Activity As tolerated: NO showers for 3 days No heavy activities  Diet: Regular  Wound Care: Keep dressing clean & dry for 3 days.  Keep wrap applied with compression as much as possible.    Do not change dressings for 3 days unless soiled.  Can change if needed but make sure to reapply wrap. After three days can remove wrap and shower.  Then reapply dressings if needed and continue compression with wrap or soft sports bra. Call doctor if any unusual problems occur such as pain, excessive bleeding, unrelieved nausea/vomiting, fever &/or chills  Follow-up appointment: Scheduled for next week.   Post Anesthesia Home Care Instructions  Activity: Get plenty of rest for the remainder of the day. A responsible individual must stay with you for 24 hours following the procedure.  For the next 24 hours, DO NOT: -Drive a car -Operate machinery -Drink alcoholic beverages -Take any medication unless instructed by your physician -Make any legal decisions or sign important papers.  Meals: Start with liquid foods such as gelatin or soup. Progress to regular foods as tolerated. Avoid greasy, spicy, heavy foods. If nausea and/or vomiting occur, drink only clear liquids until the nausea and/or vomiting subsides. Call your physician if vomiting continues.  Special Instructions/Symptoms: Your throat may feel dry or sore from the anesthesia or the breathing tube placed in your throat during surgery. If this causes discomfort, gargle with warm salt water. The discomfort should disappear within 24 hours.  If you had a scopolamine patch placed behind your ear for the management of post- operative nausea and/or vomiting:  1. The medication in the patch is effective for 72 hours, after which it should be removed.  Wrap patch in a tissue and discard in the trash. Wash hands thoroughly with soap and water. 2. You may remove the patch earlier than 72 hours if you experience unpleasant side effects which may  include dry mouth, dizziness or visual disturbances. 3. Avoid touching the patch. Wash your hands with soap and water after contact with the patch.     

## 2020-05-08 NOTE — Interval H&P Note (Signed)
History and Physical Interval Note:  05/08/2020 9:19 AM  [name]  has presented today for surgery, with the diagnosis of macromastia and right breast cyst.  The various methods of treatment have been discussed with the patient and family. After consideration of risks, benefits and other options for treatment, the patient has consented to  Procedure(s): MAMMARY REDUCTION  (BREAST) (Bilateral) CYST EXCISION BREAST (Right) as a surgical intervention.  The patient's history has been reviewed, patient examined, no change in status, stable for surgery.  I have reviewed the patient's chart and labs.  Questions were answered to the patient's satisfaction.     Cindra Presume

## 2020-05-08 NOTE — Transfer of Care (Signed)
Immediate Anesthesia Transfer of Care Note  Patient: [name]  Procedure(s) Performed: MAMMARY REDUCTION  (BREAST) (Bilateral Breast) CYST EXCISION BREAST (Right Breast)  Patient Location: PACU  Anesthesia Type:General  Level of Consciousness: awake, alert , oriented and patient cooperative  Airway & Oxygen Therapy: Patient Spontanous Breathing and Patient connected to face mask oxygen  Post-op Assessment: Report given to RN and Post -op Vital signs reviewed and stable  Post vital signs: Reviewed and stable  Last Vitals:  Vitals Value Taken Time  BP 150/96 05/08/20 1211  Temp    Pulse 94 05/08/20 1212  Resp    SpO2 100 % 05/08/20 1212  Vitals shown include unvalidated device data.  Last Pain:  Vitals:   05/08/20 0835  TempSrc: Oral  PainSc: 0-No pain      Patients Stated Pain Goal: 3 (47/34/03 7096)  Complications: No complications documented.

## 2020-05-08 NOTE — Brief Op Note (Signed)
05/08/2020  12:00 PM  PATIENT:  [name]  55 y.o. female  PRE-OPERATIVE DIAGNOSIS:  macromastia and right breast cyst  POST-OPERATIVE DIAGNOSIS:  macromastia and right breast cyst  PROCEDURE:  Procedure(s): MAMMARY REDUCTION  (BREAST) (Bilateral) CYST EXCISION BREAST (Right)  SURGEON:  Surgeon(s) and Role:    * Shyhiem Beeney, Steffanie Dunn, MD - Primary  PHYSICIAN ASSISTANT: [place], RNFA  ASSISTANTS: none   ANESTHESIA:   general  EBL:  50   BLOOD ADMINISTERED:none  DRAINS: none   LOCAL MEDICATIONS USED:  MARCAINE     SPECIMEN:  Source of Specimen:  r and l breast excision and r breast cyst  DISPOSITION OF SPECIMEN:  PATHOLOGY  COUNTS:  YES  TOURNIQUET:  * No tourniquets in log *  DICTATION: .Dragon Dictation  PLAN OF CARE: Discharge to home after PACU  PATIENT DISPOSITION:  PACU - hemodynamically stable.   Delay start of Pharmacological VTE agent (>24hrs) due to surgical blood loss or risk of bleeding: not applicable

## 2020-05-09 ENCOUNTER — Encounter (HOSPITAL_BASED_OUTPATIENT_CLINIC_OR_DEPARTMENT_OTHER): Payer: Self-pay | Admitting: Plastic Surgery

## 2020-05-10 LAB — SURGICAL PATHOLOGY

## 2020-05-11 ENCOUNTER — Telehealth: Payer: Self-pay

## 2020-05-11 NOTE — Telephone Encounter (Signed)
Call to pt to inform her of her pathology report per Dr. Claudia Desanctis: Bilateral non-invasive pre-cancer findings from bilateral breast tissue specimens sent to pathology during  breast reduction surgery on 05/08/20 with Dr. Claudia Desanctis. Dr. Claudia Desanctis recommends/referred pt to Dr. Chryl Heck @ [facility] for evaluation & then to Dr. Donne Hazel @ [facility] afterwards. She has a f/u with Dr. Claudia Desanctis on 05/17/20- he will discuss the findings with her then. I informed her that our office will forward the referral to Dr. Chryl Heck & his office will call her to schedule that appointment. She understands the above information.  On further assessment- she notes that she has had minimal drainage- right breast & none on the left. Denies fever/chills and incisions appear intact. She has showered today & replaced gauze dressings & is using sports bra & one ace wrap for compression & has minimal pain. She is eating/drinking & normal bowel/bladder function. I reminded her that she can call for any questions or concerns

## 2020-05-17 ENCOUNTER — Encounter: Payer: Self-pay | Admitting: Plastic Surgery

## 2020-05-17 ENCOUNTER — Ambulatory Visit (INDEPENDENT_AMBULATORY_CARE_PROVIDER_SITE_OTHER): Payer: 59 | Admitting: Plastic Surgery

## 2020-05-17 ENCOUNTER — Other Ambulatory Visit: Payer: Self-pay

## 2020-05-17 VITALS — BP 143/84 | HR 76

## 2020-05-17 DIAGNOSIS — N62 Hypertrophy of breast: Secondary | ICD-10-CM

## 2020-05-17 NOTE — Progress Notes (Signed)
Patient presents 1 week postop from bilateral breast reduction and removal of an epidermal inclusion cyst from the right breast.  She feels like she is doing well with a little bit of soreness but feels like things are progressing.  On exam everything looks to be healing fine with intact incisions and no subcutaneous fluid.  There is minimal bruising.  Nipple areolar complexes are viable.  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  We are working to refer her to breast oncologist to discuss these findings and determine appropriate course of action.  I explained them as best I could but ultimately ask for the assistance of the oncologist to direct evaluation and treatment of that problem.  Patient is understanding and I will plan to see her again in a few weeks.  We discussed continuing with compressive garments and avoiding strenuous activity.

## 2020-05-19 ENCOUNTER — Telehealth: Payer: Self-pay | Admitting: Hematology and Oncology

## 2020-05-19 NOTE — Telephone Encounter (Signed)
Received a new pt referral from Dr. Claudia Desanctis for [name]. [name] has been cld and scheduled to see Dr. Chryl Heck on 4/6 at 3pm. She's been made aware to arrive 20 minutes early.

## 2020-05-24 ENCOUNTER — Inpatient Hospital Stay: Payer: 59 | Admitting: Hematology and Oncology

## 2020-05-24 ENCOUNTER — Telehealth: Payer: Self-pay | Admitting: Oncology

## 2020-05-24 NOTE — Progress Notes (Signed)
[place]  Telephone:(336) [phone] Fax:(336) [phone]     ID: [name] DOB: 05-Apr-1965  MR#: [id number]  AGT#:[id number]  Patient Care Team: Lilian Coma., MD as PCP - General (Internal Medicine) Eusebio Friendly, MD as Consulting Physician (Gastroenterology) Claudia Desanctis Steffanie Dunn, MD as Consulting Physician (Plastic Surgery) Ernesto Lashway, Virgie Dad, MD as Consulting Physician (Oncology) Chauncey Cruel, MD OTHER MD:  CHIEF COMPLAINT: Breast cancer high risk  CURRENT TREATMENT: Anastrozole; intensified screening   HISTORY OF CURRENT ILLNESS: [name] underwent bilateral breast reduction on 05/08/2020 under Dr. Claudia Desanctis. Pathology from the procedure [id number]) showed: 1. Right Breast, reduction  - lobular neoplasia (lobular carcinoma in situ)  - usual ductal hyperplasia, columnar cell and fibrocystic changes  - intraductal papilloma  - calcifications 2. Left Breast, reduction  - focal atypical ductal hyperplasia  - lobular neoplasia (atypical lobular hyperplasia)  - usual ductal hyperplasia, columnar cell and fibrocystic changes  - calcifications  The patient's subsequent history is as detailed below.   INTERVAL HISTORY: [name] was evaluated in the breast cancer clinic on 05/25/2020 accompanied by her daughter [name].   REVIEW OF SYSTEMS: [name] did well with her surgery, with no significant pain, bleeding, dehiscence, or fever.  She denies unusual headaches, visual changes, nausea, vomiting, stiff neck, dizziness, or gait imbalance. There has been no cough, phlegm production, or pleurisy, no chest pain or pressure, and no change in bowel or bladder habits.  [name] has a treadmill at home and tries to use it most days.  A detailed review of systems was otherwise entirely negative.   COVID 19 VACCINATION STATUS: fully vaccinated [employer]), with booster 11/2019   PAST MEDICAL HISTORY: Past Medical History:  Diagnosis Date  . Anemia   . Carpal  tunnel syndrome on left   . Chronic lower back pain   . Diabetes mellitus without complication ([place])     PAST SURGICAL HISTORY: Past Surgical History:  Procedure Laterality Date  . ANTERIOR CERVICAL DECOMP/DISCECTOMY FUSION  07/2008   C4-5; C5-6  . BREAST CYST EXCISION Right 05/08/2020   Procedure: CYST EXCISION BREAST;  Surgeon: Cindra Presume, MD;  Location: [place];  Service: Plastics;  Laterality: Right;  . BREAST REDUCTION SURGERY Bilateral 05/08/2020   Procedure: MAMMARY REDUCTION  (BREAST);  Surgeon: Cindra Presume, MD;  Location: [place];  Service: Plastics;  Laterality: Bilateral;  . CARPAL TUNNEL RELEASE  2010   left  . POSTERIOR FUSION LUMBAR SPINE  05/03/11   "& put in 3 screws"  . SHOULDER ARTHROSCOPY W/ ROTATOR CUFF REPAIR  11/2008   left  . SHOULDER OPEN ROTATOR CUFF REPAIR  02/2010   right  . TUBAL LIGATION  1980's  . VAGINAL HYSTERECTOMY  10/2008   w/partial right salpingectomy    FAMILY HISTORY: No family history on file.  The patient has no information on her father or his side of the family.  The patient's mother died in her late 61s from "stomach cancer".  The maternal grandfather died from throat cancer.  A maternal uncle died from cancer of unknown type.  The patient herself has no brothers, 4 sisters.  There are no cancers in her siblings  GYNECOLOGIC HISTORY:  No LMP recorded. Patient has had a hysterectomy. Menarche: 55 years old Age at first live birth: 55 years old [place] P 2 LMP at the time of hysterectomy HRT no  Hysterectomy? Yes, 10/2008 BSO? No (right salpingectomy only)   SOCIAL HISTORY: (updated 05/2020)  [name] never married.  Currently she is staying with her daughter [name] just for the postop period.  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  The patient's son [name] also lives with the  patient.  He is 40.  The patient has 5 grandchildren and 1 great grandchild.  ADVANCED DIRECTIVES: Not in place.  The patient tells me she intends to name her daughter as her healthcare power of attorney   HEALTH MAINTENANCE: Social History   Tobacco Use  . Smoking status: Former Smoker    Packs/day: 2.00    Years: 11.00    Pack years: 22.00    Types: Cigarettes    Quit date: 02/18/1990    Years since quitting: 30.2  . Smokeless tobacco: Never Used  Substance Use Topics  . Alcohol use: Yes    Comment: 05/03/11 "drink a little once or twice a year"  . Drug use: Yes    Types: Marijuana    Comment: "last marijuana ~ 1992"     Colonoscopy: 01/2020 (Dr. Cindee Salt)  PAP: Status post hysterectomy  Bone density: 12/2019, +0.6   No Known Allergies  Current Outpatient Medications  Medication Sig Dispense Refill  . metFORMIN (GLUCOPHAGE) 1000 MG tablet Take 1,000 mg by mouth 2 (two) times daily.    . ondansetron (ZOFRAN) 4 MG tablet Take 1 tablet (4 mg total) by mouth every 8 (eight) hours as needed for nausea or vomiting. 20 tablet 0   No current facility-administered medications for this visit.    OBJECTIVE: African-American woman who appears stated age  55:   05/25/20 1614  BP: (!) 137/51  Pulse: 88  Resp: 18  Temp: (!) 97.2 F (36.2 C)  SpO2: 98%     Body mass index is 35.19 kg/m.   Wt Readings from Last 3 Encounters:  05/08/20 218 lb 0.6 oz (98.9 kg)  04/17/20 217 lb 9.6 oz (98.7 kg)  11/25/19 226 lb 9.6 oz (102.8 kg)      ECOG FS:1 - Symptomatic but completely ambulatory  Ocular: Sclerae unicteric, pupils round and equal Ear-nose-throat: Wearing a mask Lymphatic: No cervical or supraclavicular adenopathy Lungs no rales or rhonchi Heart regular rate and rhythm Abd soft, nontender, positive bowel sounds MSK no focal spinal tenderness, no joint edema Neuro: non-focal, well-oriented, appropriate affect Breasts: Status post bilateral lateral reduction.  The  incisions are healing nicely, without swelling, erythema, or dehiscence.  The cosmetic result is excellent.  There are no findings of concern.  Both axillae are benign.   LAB RESULTS:  CMP     Component Value Date/Time   NA 140 05/25/2020 1600   K 4.1 05/25/2020 1600   CL 103 05/25/2020 1600   CO2 24 05/25/2020 1600   GLUCOSE 118 (H) 05/25/2020 1600   BUN 14 05/25/2020 1600   CREATININE 0.80 05/25/2020 1600   CALCIUM 9.2 05/25/2020 1600   PROT 8.0 05/25/2020 1600   ALBUMIN 3.7 05/25/2020 1600   AST 13 (L) 05/25/2020 1600   ALT 12 05/25/2020 1600   ALKPHOS 122 05/25/2020 1600   BILITOT 0.4 05/25/2020 1600   GFRNONAA >60 05/25/2020 1600   GFRAA >90 05/12/2014 1741    No results found for: TOTALPROTELP, ALBUMINELP, A1GS, A2GS, BETS, BETA2SER, GAMS, MSPIKE, SPEI  Lab Results  Component Value Date   WBC 8.8 05/25/2020   NEUTROABS 5.9 05/25/2020   HGB 12.6 05/25/2020   HCT  39.5 05/25/2020   MCV 95.0 05/25/2020   PLT 361 05/25/2020    No results found for: LABCA2  No components found for: BPZWCH852  No results for input(s): INR in the last 168 hours.  No results found for: LABCA2  No results found for: DPO242  No results found for: PNT614  No results found for: ERX540  No results found for: CA2729  No components found for: HGQUANT  No results found for: CEA1 / No results found for: CEA1   No results found for: AFPTUMOR  No results found for: CHROMOGRNA  No results found for: KPAFRELGTCHN, LAMBDASER, KAPLAMBRATIO (kappa/lambda light chains)  No results found for: HGBA, HGBA2QUANT, HGBFQUANT, HGBSQUAN (Hemoglobinopathy evaluation)   No results found for: LDH  No results found for: IRON, TIBC, IRONPCTSAT (Iron and TIBC)  No results found for: FERRITIN  Urinalysis No results found for: COLORURINE, APPEARANCEUR, LABSPEC, PHURINE, GLUCOSEU, HGBUR, BILIRUBINUR, KETONESUR, PROTEINUR, UROBILINOGEN, NITRITE, LEUKOCYTESUR   STUDIES: Most recent  mammography was November 08, 2019  ELIGIBLE FOR AVAILABLE RESEARCH PROTOCOL: no  ASSESSMENT: 55 y.o. [place] woman at high risk for breast cancer on the basis of lobular carcinoma in situ and atypical ductal hyperplasia  (1) anastrozole started 05/29/2020 for risk reduction  (a) bone density November 2021 normal with a T score of +0.6  (2) intensified screening:  (a) yearly mammography with tomography September   (b) yearly breast MRI March  (c) biannual breast exam  PLAN: I met today with [name] to review her new diagnosis. Specifically we discussed the biology of her breast cancer, its diagnosis, staging, treatment  options and prognosis.  We discussed atypical ductal hyperplasia and this means that there are too many cells in the milk ducts and that the cells are not entirely normal.  This is not a cancer but it is a marker of increased breast cancer risk and easily brings the risk of breast cancer to 1 %/year.  In addition she has lobular carcinoma in situ.  Most breast cancer experts do not consider this a cancer despite the word "carcinoma" in the name.  Lobular carcinoma in situ can be a "field defect", namely found in many parts of the breast are both breasts and it is not necessarily the case that any particular area of LCIS will become cancer.  This however is also a marker of breast cancer risk.  The cancers associated with LCIS could be ductal could be invasive could be noninvasive or could be lobular.  It could be in the opposite breast.  In short this is another marker of breast risk and given the fact that she has both I would quoted her a 2 to 3 %/year chance of developing breast cancer.  She has 2 ways of dealing with this increased risk.  One is decreasing the risk.  The other is managing the risk.  She has already decreased the risk somewhat by removing a good portion of her breast.  Of course if she removed all the breast the risk would drop to close to 0.  We do not  recommend bilateral mastectomies however in this setting.  There is no survival advantage and there are significant physical and psychosocial issues related to bilateral mastectomies with or without reconstruction.  However she can cut the residual risk in half by taking antiestrogens for 5 years.  We specifically discussed anastrozole and she has a good understanding of the possible toxicities side effects and complications of this agent.  She has a very favorable  baseline bone density and she tells me she has no trouble dealing with hot flashes.  She wanted to go ahead with this and I have placed the prescription in for her  The way to manage the risk is to consider intensified screening.  In this case she would have mammography in September as she usually does but then we would add an MRI of the breast in March, 6 months after the mammogram.  She would continue alternating mammography and breast MRI every 6 months indefinitely or until the breast density significantly decreases.  While this does nothing to decrease the risk of breast cancer it's make it likely that if breast cancer develops it would be found at the very earliest time when the patient not only can have the highest chance of cure but also the least need for chemotherapy or disfiguring surgery  [name] understands there may be cost issues associated with MRI and also issues with false positives which were discussed today.  After much discussion what she would like to do is both.  We are starting anastrozole now.  She will be set up for mammography through her primary care physician Dr. Valora Piccolo in September as usual.  She already has an appointment with him later this month and if Dr. Valora Piccolo is agreeable to setting her up for MRIs in March of next year and subsequently then there really is no further reason for her to see me.  I will set her up for a virtual visit in about 2 months just to make sure she is tolerating anastrozole well  [name] has  a good understanding of the overall plan. She agrees with it. She knows the goal of treatment in her case is prevention. She will call with any problems that may develop before her next visit here.  Total encounter time 65 minutes.Sarajane Jews C. Jerita Wimbush, MD 05/25/2020 5:00 PM Medical Oncology and Hematology [facility] [address], [place] [address] Tel. [phone]    Fax. [phone]   This document serves as a record of services personally performed by Lurline Del, MD. It was created on his behalf by Wilburn Mylar, a trained medical scribe. The creation of this record is based on the scribe's personal observations and the provider's statements to them.   I, Lurline Del MD, have reviewed the above documentation for accuracy and completeness, and I agree with the above.    *Total Encounter Time as defined by the Centers for Medicare and Medicaid Services includes, in addition to the face-to-face time of a patient visit (documented in the note above) non-face-to-face time: obtaining and reviewing outside history, ordering and reviewing medications, tests or procedures, care coordination (communications with other health care professionals or caregivers) and documentation in the medical record.

## 2020-05-24 NOTE — Telephone Encounter (Signed)
I cld [name] to reschedule her to see Dr. Jana Hakim on 4/7 at 430pm w/labs at 4pm. Pt aware toa rrive 15 minutes early.

## 2020-05-25 ENCOUNTER — Other Ambulatory Visit: Payer: Self-pay

## 2020-05-25 ENCOUNTER — Inpatient Hospital Stay: Payer: 59 | Attending: Oncology | Admitting: Oncology

## 2020-05-25 ENCOUNTER — Inpatient Hospital Stay: Payer: 59

## 2020-05-25 DIAGNOSIS — D0501 Lobular carcinoma in situ of right breast: Secondary | ICD-10-CM | POA: Diagnosis not present

## 2020-05-25 DIAGNOSIS — Z808 Family history of malignant neoplasm of other organs or systems: Secondary | ICD-10-CM | POA: Insufficient documentation

## 2020-05-25 DIAGNOSIS — Z9189 Other specified personal risk factors, not elsewhere classified: Secondary | ICD-10-CM | POA: Diagnosis not present

## 2020-05-25 DIAGNOSIS — Z1239 Encounter for other screening for malignant neoplasm of breast: Secondary | ICD-10-CM

## 2020-05-25 DIAGNOSIS — Z809 Family history of malignant neoplasm, unspecified: Secondary | ICD-10-CM | POA: Diagnosis not present

## 2020-05-25 DIAGNOSIS — D62 Acute posthemorrhagic anemia: Secondary | ICD-10-CM

## 2020-05-25 DIAGNOSIS — Z87891 Personal history of nicotine dependence: Secondary | ICD-10-CM | POA: Diagnosis not present

## 2020-05-25 DIAGNOSIS — N6092 Unspecified benign mammary dysplasia of left breast: Secondary | ICD-10-CM | POA: Diagnosis not present

## 2020-05-25 LAB — CMP (CANCER CENTER ONLY)
ALT: 12 U/L (ref 0–44)
AST: 13 U/L — ABNORMAL LOW (ref 15–41)
Albumin: 3.7 g/dL (ref 3.5–5.0)
Alkaline Phosphatase: 122 U/L (ref 38–126)
Anion gap: 13 (ref 5–15)
BUN: 14 mg/dL (ref 6–20)
CO2: 24 mmol/L (ref 22–32)
Calcium: 9.2 mg/dL (ref 8.9–10.3)
Chloride: 103 mmol/L (ref 98–111)
Creatinine: 0.8 mg/dL (ref 0.44–1.00)
GFR, Estimated: >60 mL/min (ref >60)
Glucose, Bld: 118 mg/dL — ABNORMAL HIGH (ref 70–99)
Potassium: 4.1 mmol/L (ref 3.5–5.1)
Sodium: 140 mmol/L (ref 135–145)
Total Bilirubin: 0.4 mg/dL (ref 0.3–1.2)
Total Protein: 8 g/dL (ref 6.5–8.1)

## 2020-05-25 LAB — CBC WITH DIFFERENTIAL (CANCER CENTER ONLY)
Abs Immature Granulocytes: 0.03 10*3/uL (ref 0.00–0.07)
Basophils Absolute: 0 10*3/uL (ref 0.0–0.1)
Basophils Relative: 1 %
Eosinophils Absolute: 0.1 10*3/uL (ref 0.0–0.5)
Eosinophils Relative: 1 %
HCT: 39.5 % (ref 36.0–46.0)
Hemoglobin: 12.6 g/dL (ref 12.0–15.0)
Immature Granulocytes: 0 %
Lymphocytes Relative: 27 %
Lymphs Abs: 2.4 10*3/uL (ref 0.7–4.0)
MCH: 30.3 pg (ref 26.0–34.0)
MCHC: 31.9 g/dL (ref 30.0–36.0)
MCV: 95 fL (ref 80.0–100.0)
Monocytes Absolute: 0.4 10*3/uL (ref 0.1–1.0)
Monocytes Relative: 4 %
Neutro Abs: 5.9 10*3/uL (ref 1.7–7.7)
Neutrophils Relative %: 67 %
Platelet Count: 361 10*3/uL (ref 150–400)
RBC: 4.16 MIL/uL (ref 3.87–5.11)
RDW: 13.6 % (ref 11.5–15.5)
WBC Count: 8.8 10*3/uL (ref 4.0–10.5)
nRBC: 0 % (ref 0.0–0.2)

## 2020-05-25 MED ORDER — ANASTROZOLE 1 MG PO TABS: 1.0000 mg | ORAL_TABLET | Freq: Every day | ORAL | 4 refills | Status: DC

## 2020-05-26 ENCOUNTER — Encounter: Payer: 59 | Admitting: Surgical

## 2020-05-29 ENCOUNTER — Other Ambulatory Visit: Payer: Self-pay

## 2020-05-29 MED ORDER — ANASTROZOLE 1 MG PO TABS: 1.0000 mg | ORAL_TABLET | Freq: Every day | ORAL | 4 refills | Status: AC

## 2020-06-14 ENCOUNTER — Ambulatory Visit (INDEPENDENT_AMBULATORY_CARE_PROVIDER_SITE_OTHER): Payer: 59 | Admitting: Plastic Surgery

## 2020-06-14 ENCOUNTER — Other Ambulatory Visit: Payer: Self-pay

## 2020-06-14 DIAGNOSIS — N62 Hypertrophy of breast: Secondary | ICD-10-CM

## 2020-06-15 NOTE — Progress Notes (Signed)
Patient is here about a month postop from bilateral breast reduction.  She overall feels like she is doing well.  There is a few spitting sutures along the inframammary crease and the Steri-Strips are still in place around her nipple.  I remove the Steri-Strips in removed a few spitting sutures and staples.  Otherwise the incisions are intact and look good.  She has a nice shape size and symmetry and she is happy with the result.  We will have her apply Vaseline to this of the few small open areas until they closed and see her again in a few weeks.  All her questions were answered.

## 2020-07-12 ENCOUNTER — Other Ambulatory Visit: Payer: Self-pay

## 2020-07-12 ENCOUNTER — Ambulatory Visit (INDEPENDENT_AMBULATORY_CARE_PROVIDER_SITE_OTHER): Payer: 59 | Admitting: Plastic Surgery

## 2020-07-12 DIAGNOSIS — N62 Hypertrophy of breast: Secondary | ICD-10-CM

## 2020-07-12 NOTE — Progress Notes (Signed)
Patient is here about 2 months postop from bilateral breast reduction.  Overall she feels good and is very happy with her result.  On examination she has good shape size and symmetry.  All of her incisions are intact and is healed nicely.  She can wear what ever kind of bra she wants and has no activity restrictions.  We will plan to see her again on an as-needed basis.  All of her questions were answered.

## 2020-08-16 ENCOUNTER — Encounter: Payer: Self-pay | Admitting: Oncology

## 2020-08-16 NOTE — Progress Notes (Signed)
[place]  Telephone:(336) [phone] Fax:(336) [phone]     ID: [name] DOB: [birth year]  MR#: [id number]  ZHY#:[id number]  Patient Care Team: Lilian Coma., MD as PCP - General (Internal Medicine) Eusebio Friendly, MD as Consulting Physician (Gastroenterology) Claudia Desanctis Steffanie Dunn, MD as Consulting Physician (Plastic Surgery) Samina Weekes, Virgie Dad, MD as Consulting Physician (Oncology) Aurea Graff OTHER MD:    CHIEF COMPLAINT: Breast cancer high risk  CURRENT TREATMENT: Anastrozole; intensified screening   INTERVAL HISTORY: I attempted to connect with [name] today.  She was evaluated in the breast cancer clinic on 05/25/2020.  However when I called had the set time there was no answer.  I suggested she call us back and she did call back and left a message.  When my nurse call back however she could not get through.  She was started on anastrozole at consultation on 05/25/2020.  She was also scheduled for an MRI of the breast in April and that was not done   REVIEW OF SYSTEMS: [name] 19 VACCINATION STATUS: fully vaccinated [employer]), with booster 11/2019   HISTORY OF CURRENT ILLNESS: From the original intake note:  [name] underwent bilateral breast reduction on 05/08/2020 under Dr. Claudia Desanctis. Pathology from the procedure [id number]) showed: 1. Right Breast, reduction  - lobular neoplasia (lobular carcinoma in situ)  - usual ductal hyperplasia, columnar cell and fibrocystic changes  - intraductal papilloma  - calcifications 2. Left Breast, reduction  - focal atypical ductal hyperplasia  - lobular neoplasia (atypical lobular hyperplasia)  - usual ductal hyperplasia, columnar cell and fibrocystic changes  - calcifications  The patient's subsequent history is as detailed below.   PAST MEDICAL HISTORY: Past Medical History:  Diagnosis Date   Anemia    Carpal tunnel syndrome on left    Chronic lower back pain    Diabetes mellitus  without complication ([place])     PAST SURGICAL HISTORY: Past Surgical History:  Procedure Laterality Date   ANTERIOR CERVICAL DECOMP/DISCECTOMY FUSION  07/2008   C4-5; C5-6   BREAST CYST EXCISION Right 05/08/2020   Procedure: CYST EXCISION BREAST;  Surgeon: Cindra Presume, MD;  Location: [place];  Service: Plastics;  Laterality: Right;   BREAST REDUCTION SURGERY Bilateral 05/08/2020   Procedure: MAMMARY REDUCTION  (BREAST);  Surgeon: Cindra Presume, MD;  Location: [place];  Service: Plastics;  Laterality: Bilateral;   CARPAL TUNNEL RELEASE  2010   left   POSTERIOR FUSION LUMBAR SPINE  05/03/11   "& put in 3 screws"   SHOULDER ARTHROSCOPY W/ ROTATOR CUFF REPAIR  11/2008   left   SHOULDER OPEN ROTATOR CUFF REPAIR  02/2010   right   TUBAL LIGATION  1980's   VAGINAL HYSTERECTOMY  10/2008   w/partial right salpingectomy    FAMILY HISTORY: Family History  Problem Relation Age of Onset   Stomach cancer Mother    Cancer Maternal Uncle        type unknown   Throat cancer Maternal Grandfather    The patient has no information on her father or his side of the family.  The patient's mother died in her late 6s from "stomach cancer".  The maternal grandfather died from throat cancer.  A maternal uncle died from cancer of unknown type.  The patient herself has no brothers, 4 sisters.  There are no cancers in her siblings   GYNECOLOGIC HISTORY:  No LMP recorded. Patient has had a hysterectomy.  Menarche: 55 years old Age at first live birth: 55 years old [place] P 2 LMP at the time of hysterectomy HRT no  Hysterectomy? Yes, 10/2008 BSO? No (right salpingectomy only)   SOCIAL HISTORY: (updated 05/2020)  [name] never married.  Currently she is staying with her daughter [name] just for the postop period.  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  The  patient's son [name] also lives with the patient.  He is 40.  The patient has 5 grandchildren and 1 great grandchild.   ADVANCED DIRECTIVES: Not in place.  The patient tells me she intends to name her daughter as her healthcare power of attorney   HEALTH MAINTENANCE: Social History   Tobacco Use   Smoking status: Former    Packs/day: 2.00    Years: 11.00    Pack years: 22.00    Types: Cigarettes    Quit date: 02/18/1990    Years since quitting: 30.5   Smokeless tobacco: Never  Substance Use Topics   Alcohol use: Yes    Comment: 05/03/11 "drink a little once or twice a year"   Drug use: Yes    Types: Marijuana    Comment: "last marijuana ~ 1992"     Colonoscopy: 01/2020 (Dr. Cindee Salt)  PAP: Status post hysterectomy  Bone density: 12/2019, +0.6   No Known Allergies  Current Outpatient Medications  Medication Sig Dispense Refill   FARXIGA 5 MG TABS tablet Take 5 mg by mouth every morning.     metFORMIN (GLUCOPHAGE) 1000 MG tablet Take 1,000 mg by mouth 2 (two) times daily.     rosuvastatin (CRESTOR) 5 MG tablet Take by mouth.     No current facility-administered medications for this visit.    OBJECTIVE: African-American woman who appears stated age  There were no vitals filed for this visit.    There is no height or weight on file to calculate BMI.   Wt Readings from Last 3 Encounters:  05/08/20 218 lb 0.6 oz (98.9 kg)  04/17/20 217 lb 9.6 oz (98.7 kg)  11/25/19 226 lb 9.6 oz (102.8 kg)      ECOG FS:1 - Symptomatic but completely ambulatory  Telemedicine visit 08/17/2020    LAB RESULTS:  CMP     Component Value Date/Time   NA 140 05/25/2020 1600   K 4.1 05/25/2020 1600   CL 103 05/25/2020 1600   CO2 24 05/25/2020 1600   GLUCOSE 118 (H) 05/25/2020 1600   BUN 14 05/25/2020 1600   CREATININE 0.80 05/25/2020 1600   CALCIUM 9.2 05/25/2020 1600   PROT 8.0 05/25/2020 1600   ALBUMIN 3.7 05/25/2020 1600   AST 13 (L) 05/25/2020 1600   ALT 12 05/25/2020 1600    ALKPHOS 122 05/25/2020 1600   BILITOT 0.4 05/25/2020 1600   GFRNONAA >60 05/25/2020 1600   GFRAA >90 05/12/2014 1741    No results found for: TOTALPROTELP, ALBUMINELP, A1GS, A2GS, BETS, BETA2SER, GAMS, MSPIKE, SPEI  Lab Results  Component Value Date   WBC 8.8 05/25/2020   NEUTROABS 5.9 05/25/2020   HGB 12.6 05/25/2020   HCT 39.5 05/25/2020   MCV 95.0 05/25/2020   PLT 361 05/25/2020    No results found for: LABCA2  No components found for: [id number]  No results for input(s): INR in the last 168 hours.  No results found for: LABCA2  No results found for: FUX323  No results  found for: INO676  No results found for: HMC947  No results found for: CA2729  No components found for: HGQUANT  No results found for: CEA1 / No results found for: CEA1   No results found for: AFPTUMOR  No results found for: CHROMOGRNA  No results found for: KPAFRELGTCHN, LAMBDASER, KAPLAMBRATIO (kappa/lambda light chains)  No results found for: HGBA, HGBA2QUANT, HGBFQUANT, HGBSQUAN (Hemoglobinopathy evaluation)   No results found for: LDH  No results found for: IRON, TIBC, IRONPCTSAT (Iron and TIBC)  No results found for: FERRITIN  Urinalysis No results found for: COLORURINE, APPEARANCEUR, LABSPEC, PHURINE, GLUCOSEU, HGBUR, BILIRUBINUR, KETONESUR, PROTEINUR, UROBILINOGEN, NITRITE, LEUKOCYTESUR   STUDIES: No results found.   ELIGIBLE FOR AVAILABLE RESEARCH PROTOCOL: no  ASSESSMENT: 55 y.o. 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  I have put in a LOS to get that accomplished and I have sent her a letter as well.   Virgie Dad. Rodriquez Thorner, MD 08/16/2020 3:29 PM Medical Oncology and Hematology [facility] [address], [place] [address] Tel. [phone]    Fax. [phone]   This document serves as a record of services personally performed by Lurline Del, MD. It was created on his behalf by Wilburn Mylar, a trained medical scribe. The creation of this record is based on the scribe's personal observations and the provider's statements to them.   I, Lurline Del MD, have reviewed the above documentation for accuracy and completeness, and I agree with the above.   *Total Encounter Time as defined by the Centers for Medicare and Medicaid Services includes, in addition to the face-to-face time of a patient visit (documented in the note above) non-face-to-face time: obtaining and reviewing outside history, ordering and reviewing medications, tests or procedures, care coordination (communications with other health care professionals or caregivers) and documentation in the medical record.

## 2020-08-17 ENCOUNTER — Encounter: Payer: Self-pay | Admitting: Oncology

## 2020-08-17 ENCOUNTER — Other Ambulatory Visit: Payer: Self-pay

## 2020-08-17 ENCOUNTER — Inpatient Hospital Stay: Payer: 59 | Attending: Oncology | Admitting: Oncology

## 2020-08-17 ENCOUNTER — Encounter: Payer: 59 | Admitting: Adult Health

## 2020-08-17 DIAGNOSIS — D0501 Lobular carcinoma in situ of right breast: Secondary | ICD-10-CM

## 2020-08-18 ENCOUNTER — Telehealth: Payer: Self-pay | Admitting: Oncology

## 2020-08-18 NOTE — Telephone Encounter (Signed)
Scheduled appointment per 06/30 los. Patient is aware.

## 2020-08-29 ENCOUNTER — Telehealth: Payer: Self-pay | Admitting: Adult Health

## 2020-08-29 NOTE — Telephone Encounter (Signed)
Rescheduled per provider called pt and left a msg

## 2020-08-31 ENCOUNTER — Inpatient Hospital Stay: Payer: 59 | Admitting: Adult Health

## 2020-08-31 NOTE — Progress Notes (Addendum)
[place]  [phone] [phone]) [phone]     ID: JENNESS STEMLER DOB: [birth year]  MR#: [id number]  WVP#:[id number]  Patient Care Team: Lilian Coma., MD as PCP - General (Internal Medicine) Eusebio Friendly, MD as Consulting Physician (Gastroenterology) Claudia Desanctis Steffanie Dunn, MD as Consulting Physician (Plastic Surgery) Magrinat, Virgie Dad, MD as Consulting Physician (Oncology) Scot Dock, NP OTHER MD:    CHIEF COMPLAINT: Breast cancer high risk  CURRENT TREATMENT: Anastrozole; intensified screening   INTERVAL HISTORY: [name] is here today for evaluation of her increased breast cancer risk.  She was started on anastrozole at consultation on 05/25/2020.  She is taking this daily and says that she tolerates it quite well.    She was also scheduled for an MRI of the breast in April and that was not completed at that time.  [name]'s most recent mammogram was completed in 10/2019 and showed no evidence of malignancy and breast density category B.    In November of 2021 she underwent a bone density exam that demonstrated normal bone density.  Her health maintenance was updated today.     REVIEW OF SYSTEMS: Review of Systems  Constitutional:  Negative for appetite change, chills, fatigue, fever and unexpected weight change.  HENT:   Negative for hearing loss, lump/mass and trouble swallowing.   Eyes:  Negative for eye problems and icterus.  Respiratory:  Negative for chest tightness, cough and shortness of breath.   Cardiovascular:  Negative for chest pain, leg swelling and palpitations.  Gastrointestinal:  Negative for abdominal distention, abdominal pain, constipation, diarrhea, nausea and vomiting.  Endocrine: Negative for hot flashes.  Genitourinary:  Negative for difficulty urinating.   Musculoskeletal:  Negative for arthralgias.  Skin:  Negative for itching and rash.  Neurological:  Negative for dizziness, extremity weakness, headaches and  numbness.  Hematological:  Negative for adenopathy. Does not bruise/bleed easily.  Psychiatric/Behavioral:  Negative for depression. The patient is not nervous/anxious.      COVID 19 VACCINATION STATUS: fully vaccinated [employer]), with booster 11/2019   HISTORY OF CURRENT ILLNESS: From the original intake note:  [name] underwent bilateral breast reduction on 05/08/2020 under Dr. Claudia Desanctis. Pathology from the procedure [id number]) showed: 1. Right Breast, reduction  - lobular neoplasia (lobular carcinoma in situ)  - usual ductal hyperplasia, columnar cell and fibrocystic changes  - intraductal papilloma  - calcifications 2. Left Breast, reduction  - focal atypical ductal hyperplasia  - lobular neoplasia (atypical lobular hyperplasia)  - usual ductal hyperplasia, columnar cell and fibrocystic changes  - calcifications  The patient's subsequent history is as detailed below.   PAST MEDICAL HISTORY: Past Medical History:  Diagnosis Date   Anemia    Carpal tunnel syndrome on left    Chronic lower back pain    Diabetes mellitus without complication ([place])     PAST SURGICAL HISTORY: Past Surgical History:  Procedure Laterality Date   ANTERIOR CERVICAL DECOMP/DISCECTOMY FUSION  07/2008   C4-5; C5-6   BREAST CYST EXCISION Right 05/08/2020   Procedure: CYST EXCISION BREAST;  Surgeon: Cindra Presume, MD;  Location: [facility];  Service: Plastics;  Laterality: Right;   BREAST REDUCTION SURGERY Bilateral 05/08/2020   Procedure: MAMMARY REDUCTION  (BREAST);  Surgeon: Cindra Presume, MD;  Location: [facility];  Service: Plastics;  Laterality: Bilateral;   CARPAL TUNNEL RELEASE  2010   left   POSTERIOR FUSION LUMBAR SPINE  05/03/11   "& put in 3 screws"  SHOULDER ARTHROSCOPY W/ ROTATOR CUFF REPAIR  11/2008   left   SHOULDER OPEN ROTATOR CUFF REPAIR  02/2010   right   TUBAL LIGATION  1980's   VAGINAL HYSTERECTOMY  10/2008   w/partial right  salpingectomy    FAMILY HISTORY: Family History  Problem Relation Age of Onset   Stomach cancer Mother    Cancer Maternal Uncle        type unknown   Throat cancer Maternal Grandfather    The patient has no information on her father or his side of the family.  The patient's mother died in her late 11s from "stomach cancer".  The maternal grandfather died from throat cancer.  A maternal uncle died from cancer of unknown type.  The patient herself has no brothers, 4 sisters.  There are no cancers in her siblings   GYNECOLOGIC HISTORY:  No LMP recorded. Patient has had a hysterectomy. Menarche: 55 years old Age at first live birth: 55 years old [place] P 2 LMP at the time of hysterectomy HRT no  Hysterectomy? Yes, 10/2008 BSO? No (right salpingectomy only)   SOCIAL HISTORY: (updated 05/2020)  [name] never married.  Currently she is staying with her daughter [name] just for the postop period.  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  The patient's son [name] also lives with the patient.  He is 40.  The patient has 5 grandchildren and 1 great grandchild.   ADVANCED DIRECTIVES: Not in place.  The patient tells me she intends to name her daughter as her healthcare power of attorney   HEALTH MAINTENANCE: Social History   Tobacco Use   Smoking status: Former    Packs/day: 2.00    Years: 11.00    Pack years: 22.00    Types: Cigarettes    Quit date: 02/18/1990    Years since quitting: 30.5   Smokeless tobacco: Never  Substance Use Topics   Alcohol use: Yes    Comment: 05/03/11 "drink a little once or twice a year"   Drug use: Yes    Types: Marijuana    Comment: "last marijuana ~ 1992"     Colonoscopy: 01/2020 (Dr. Cindee Salt)  PAP: Status post hysterectomy  Bone density: 12/2019, +0.6   No Known Allergies  Current Outpatient Medications  Medication Sig Dispense Refill    FARXIGA 5 MG TABS tablet Take 5 mg by mouth every morning.     metFORMIN (GLUCOPHAGE) 1000 MG tablet Take 1,000 mg by mouth 2 (two) times daily.     rosuvastatin (CRESTOR) 5 MG tablet Take by mouth.     No current facility-administered medications for this visit.    OBJECTIVE:   Vitals:   09/08/20 1047  BP: (!) 145/63  Pulse: 78  Resp: 18  Temp: 97.7 F (36.5 C)  SpO2: 100%      Body mass index is 34.61 kg/m.   Wt Readings from Last 3 Encounters:  09/08/20 214 lb 7 oz (97.3 kg)  05/08/20 218 lb 0.6 oz (98.9 kg)  04/17/20 217 lb 9.6 oz (98.7 kg)      ECOG FS:1 - Symptomatic but completely ambulatory  GENERAL: Patient is a well appearing female in no acute distress HEENT:  Sclerae anicteric.  Oropharynx clear and moist. No ulcerations or evidence of oropharyngeal candidiasis. Neck is supple.  NODES:  No cervical, supraclavicular, or axillary lymphadenopathy palpated.  BREAST EXAM:  s/p bilateral breast reduction, healing without difficulty, no mass, nodules, or sign of breast cancer LUNGS:  Clear to auscultation bilaterally.  No wheezes or rhonchi. HEART:  Regular rate and rhythm. No murmur appreciated. ABDOMEN:  Soft, nontender.  Positive, normoactive bowel sounds. No organomegaly palpated. MSK:  No focal spinal tenderness to palpation. Full range of motion bilaterally in the upper extremities. EXTREMITIES:  No peripheral edema.   SKIN:  Clear with no obvious rashes or skin changes. No nail dyscrasia. NEURO:  Nonfocal. Well oriented.  Appropriate affect.     LAB RESULTS:  CMP     Component Value Date/Time   NA 140 05/25/2020 1600   K 4.1 05/25/2020 1600   CL 103 05/25/2020 1600   CO2 24 05/25/2020 1600   GLUCOSE 118 (H) 05/25/2020 1600   BUN 14 05/25/2020 1600   CREATININE 0.80 05/25/2020 1600   CALCIUM 9.2 05/25/2020 1600   PROT 8.0 05/25/2020 1600   ALBUMIN 3.7 05/25/2020 1600   AST 13 (L) 05/25/2020 1600   ALT 12 05/25/2020 1600   ALKPHOS 122  05/25/2020 1600   BILITOT 0.4 05/25/2020 1600   GFRNONAA >60 05/25/2020 1600   GFRAA >90 05/12/2014 1741    No results found for: TOTALPROTELP, ALBUMINELP, A1GS, A2GS, BETS, BETA2SER, GAMS, MSPIKE, SPEI  Lab Results  Component Value Date   WBC 8.8 05/25/2020   NEUTROABS 5.9 05/25/2020   HGB 12.6 05/25/2020   HCT 39.5 05/25/2020   MCV 95.0 05/25/2020   PLT 361 05/25/2020    No results found for: LABCA2  No components found for: FYBOFB510  No results for input(s): INR in the last 168 hours.  No results found for: LABCA2  No results found for: CHE527  No results found for: POE423  No results found for: NTI144  No results found for: CA2729  No components found for: HGQUANT  No results found for: CEA1 / No results found for: CEA1   No results found for: AFPTUMOR  No results found for: CHROMOGRNA  No results found for: KPAFRELGTCHN, LAMBDASER, KAPLAMBRATIO (kappa/lambda light chains)  No results found for: HGBA, HGBA2QUANT, HGBFQUANT, HGBSQUAN (Hemoglobinopathy evaluation)   No results found for: LDH  No results found for: IRON, TIBC, IRONPCTSAT (Iron and TIBC)  No results found for: FERRITIN  Urinalysis No results found for: COLORURINE, APPEARANCEUR, LABSPEC, PHURINE, GLUCOSEU, HGBUR, BILIRUBINUR, KETONESUR, PROTEINUR, UROBILINOGEN, NITRITE, LEUKOCYTESUR   STUDIES: No results found.   ELIGIBLE FOR AVAILABLE RESEARCH PROTOCOL: no  ASSESSMENT: 55 y.o. 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  Her most  recent mammogram in 10/2019 was negative for any concerns.  She has no clinical or radiographic signs of breast cancer.  I recommended the following today:   Risk Reduction: Continued use of Anastrozole daily.  She is tolerating this well and her most recent bone density in 12/2019 was normal.  We also discussed healthy diet, and exercise to reduce her risk.   Intensified Screening: Repeat bilateral breast mammogram in 10/2020 alternating with breast MRI in 04/2021.  We  reviewed the incidence of false positives that are possible with breast MRI.  She understands this, and would like to proceed.  I placed orders for this today.   Health maintenance: We reviewed her health maintenance and I updated her reports.  She is unsure if she has undergone HIV screening or Hepatitis C screening, and I asked that she f/u with her PCP about this and she will let me know.    Overall Lakevia is doing quite well, and is taking good care of herself.  We reviewed the above assessment and plan in detail and she is in agreement.  She will return in April, 2023 to review her breast MRI results.  At that point we can see her every 6 months.    She knows to call for any questions that may arise between now and her next appointment.  We are happy to see her sooner if needed.  Total encounter time: 30 minutes in chart review, health maintenance review and discussion, order entry, face to face visit time, and documentation of this encounter.     Wilber Bihari, NP 09/08/20 11:07 AM Medical Oncology and Hematology [facility] [place], [place] [address] Tel. [phone]    Fax. [phone]   *Total Encounter Time as defined by the Centers for Medicare and Medicaid Services includes, in addition to the face-to-face time of a patient visit (documented in the note above) non-face-to-face time: obtaining and reviewing outside history, ordering and reviewing medications, tests or procedures, care  coordination (communications with other health care professionals or caregivers) and documentation in the medical record.

## 2020-09-08 ENCOUNTER — Inpatient Hospital Stay: Payer: 59 | Attending: Adult Health | Admitting: Adult Health

## 2020-09-08 ENCOUNTER — Other Ambulatory Visit: Payer: Self-pay

## 2020-09-08 VITALS — BP 145/63 | HR 78 | Temp 97.7°F | Resp 18 | Ht 66.0 in | Wt 214.4 lb

## 2020-09-08 DIAGNOSIS — D0501 Lobular carcinoma in situ of right breast: Secondary | ICD-10-CM | POA: Diagnosis not present

## 2020-09-08 DIAGNOSIS — N6092 Unspecified benign mammary dysplasia of left breast: Secondary | ICD-10-CM | POA: Insufficient documentation

## 2020-09-10 ENCOUNTER — Encounter: Payer: Self-pay | Admitting: Adult Health

## 2021-04-18 ENCOUNTER — Other Ambulatory Visit: Payer: 59

## 2021-05-10 ENCOUNTER — Telehealth: Payer: Self-pay | Admitting: Adult Health

## 2021-05-10 NOTE — Telephone Encounter (Signed)
Rescheduled appointment per provider PAL. Patient is aware of the changes made to her upcoming appointment. 

## 2021-05-23 ENCOUNTER — Inpatient Hospital Stay: Payer: Self-pay | Admitting: Adult Health

## 2021-05-28 ENCOUNTER — Ambulatory Visit: Payer: 59 | Admitting: Adult Health

## 2021-06-05 ENCOUNTER — Inpatient Hospital Stay: Payer: 59 | Attending: Adult Health | Admitting: Adult Health

## 2021-06-05 ENCOUNTER — Other Ambulatory Visit: Payer: Self-pay

## 2021-06-05 VITALS — BP 151/78 | HR 84 | Temp 97.7°F | Resp 18 | Ht 66.0 in | Wt 214.6 lb

## 2021-06-05 DIAGNOSIS — N6092 Unspecified benign mammary dysplasia of left breast: Secondary | ICD-10-CM

## 2021-06-05 DIAGNOSIS — Z9189 Other specified personal risk factors, not elsewhere classified: Secondary | ICD-10-CM | POA: Diagnosis not present

## 2021-06-05 DIAGNOSIS — D0501 Lobular carcinoma in situ of right breast: Secondary | ICD-10-CM | POA: Diagnosis not present

## 2021-06-05 DIAGNOSIS — D0500 Lobular carcinoma in situ of unspecified breast: Secondary | ICD-10-CM | POA: Insufficient documentation

## 2021-06-05 DIAGNOSIS — N951 Menopausal and female climacteric states: Secondary | ICD-10-CM | POA: Diagnosis not present

## 2021-06-05 DIAGNOSIS — Z79811 Long term (current) use of aromatase inhibitors: Secondary | ICD-10-CM | POA: Insufficient documentation

## 2021-06-05 DIAGNOSIS — Z87891 Personal history of nicotine dependence: Secondary | ICD-10-CM | POA: Diagnosis not present

## 2021-06-05 NOTE — Progress Notes (Signed)
[facility] Follow up: ?  ? ?[name]., MD ?[address] ?[place] [address] ? ? ?DIAGNOSIS: INCREASED RISK FOR BREAST CANCER ? 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  ?(1) anastrozole started 05/29/2020 for risk reduction ?            (a) bone density November 2021 normal with a T score of +0.6 ?  ?(2) intensified screening: ?            (a) yearly mammography with tomography September  ?            (b) yearly breast MRI March ?            (c) biannual breast exam ?  ?  ? ?CURRENT THERAPY:Anastrozole ? ?INTERVAL HISTORY: ?[name] 56 y.o. female returns for follow up of her increased risk for breast cancer.  She went to undergo breast MRI on 3/1 and was unable to complete the test because of insurance company difficulty.  She had changed from [facility] to [employer].  She was told that she had to pay out of pocket as the imaging facility did not accept her insurance at the time.  She was unable to pay and did not receive the testing.   ? ?She is exercising by walking daily on her treadmill.  She works as an Energy manager.   ? ?She is taking anastrozole daily with good tolerance.  She has manageable hot flashes, but otherwise no adverse effects. ? ? ?Patient Active Problem List  ? Diagnosis Date Noted  ?? Atypical ductal hyperplasia of left breast 05/25/2020  ?? Lobular carcinoma in situ (LCIS) of right breast 05/25/2020  ?? Breast cancer screening, high risk patient 05/25/2020  ?? At high risk for breast cancer 05/25/2020  ?? OBESITY, NOS 04/17/2006  ?? ANEMIA, ACUTE BLOOD LOSS 04/17/2006  ?? REFLUX ESOPHAGITIS 04/17/2006  ? ? ?has No Known Allergies. ? ?MEDICAL HISTORY: ?Past Medical History:  ?Diagnosis Date  ?? Anemia   ?? Carpal tunnel syndrome on left   ?? Chronic lower back pain   ?? Diabetes mellitus without complication ([place])    ? ? ?SURGICAL HISTORY: ?Past Surgical History:  ?Procedure Laterality Date  ?? ANTERIOR CERVICAL DECOMP/DISCECTOMY FUSION  07/2008  ? C4-5; C5-6  ?? BREAST CYST EXCISION Right 05/08/2020  ? Procedure: CYST EXCISION BREAST;  Surgeon: Cindra Presume, MD;  Location: [facility];  Service: Plastics;  Laterality: Right;  ?? BREAST REDUCTION SURGERY Bilateral 05/08/2020  ? Procedure: MAMMARY REDUCTION  (BREAST);  Surgeon: Cindra Presume, MD;  Location: [facility];  Service: Plastics;  Laterality: Bilateral;  ?? CARPAL TUNNEL RELEASE  2010  ? left  ?? POSTERIOR FUSION LUMBAR SPINE  05/03/11  ? "& put in 3 screws"  ?? SHOULDER ARTHROSCOPY W/ ROTATOR CUFF REPAIR  11/2008  ? left  ?? SHOULDER OPEN ROTATOR CUFF REPAIR  02/2010  ? right  ?? TUBAL LIGATION  1980's  ?? VAGINAL HYSTERECTOMY  10/2008  ? w/partial right salpingectomy  ? ? ?SOCIAL HISTORY: ?Social History  ? ?Socioeconomic History  ?? Marital status: Single  ?  Spouse name: Not on file  ?? Number of children: Not on file  ?? Years of education: Not on file  ?? Highest education level: Not on file  ?Occupational History  ??  Not on file  ?Tobacco Use  ?? Smoking status: Former  ?  Packs/day: 2.00  ?  Years: 11.00  ?  Pack years: 22.00  ?  Types: Cigarettes  ?  Quit date: 02/18/1990  ?  Years since quitting: 31.3  ?? Smokeless tobacco: Never  ?Substance and Sexual Activity  ?? Alcohol use: Yes  ?  Comment: 05/03/11 "drink a little once or twice a year"  ?? Drug use: Yes  ?  Types: Marijuana  ?  Comment: "last marijuana ~ 1992"  ?? Sexual activity: Not Currently  ?  Birth control/protection: Surgical  ?Other Topics Concern  ?? Not on file  ?Social History Narrative  ?? Not on file  ? ?Social Determinants of Health  ? ?Financial Resource Strain: Not on file  ?Food Insecurity: Not on file  ?Transportation Needs: Not on file  ?Physical Activity: Not on file  ?Stress: Not on file  ?Social Connections: Not on file  ?Intimate Partner Violence: Not on  file  ? ? ?FAMILY HISTORY: ?Family History  ?Problem Relation Age of Onset  ?? Stomach cancer Mother   ?? Cancer Maternal Uncle   ?     type unknown  ?? Throat cancer Maternal Grandfather   ? ? ?Review of Systems  ?Constitutional:  Negative for appetite change, chills, fatigue, fever and unexpected weight change.  ?HENT:   Negative for hearing loss, lump/mass and trouble swallowing.   ?Eyes:  Negative for eye problems and icterus.  ?Respiratory:  Negative for chest tightness, cough and shortness of breath.   ?Cardiovascular:  Negative for chest pain, leg swelling and palpitations.  ?Gastrointestinal:  Negative for abdominal distention, abdominal pain, constipation, diarrhea, nausea and vomiting.  ?Endocrine: Negative for hot flashes.  ?Genitourinary:  Negative for difficulty urinating.   ?Musculoskeletal:  Negative for arthralgias.  ?Skin:  Negative for itching and rash.  ?Neurological:  Negative for dizziness, extremity weakness, headaches and numbness.  ?Hematological:  Negative for adenopathy. Does not bruise/bleed easily.  ?Psychiatric/Behavioral:  Negative for depression. The patient is not nervous/anxious.    ? ? ?PHYSICAL EXAMINATION ? ?ECOG PERFORMANCE STATUS: 1 - Symptomatic but completely ambulatory ? ?Vitals:  ? 06/05/21 1221  ?BP: (!) 151/78  ?Pulse: 84  ?Resp: 18  ?Temp: 97.7 ?F (36.5 ?C)  ?SpO2: 100%  ? ? ?Physical Exam ?Constitutional:   ?   General: She is not in acute distress. ?   Appearance: Normal appearance. She is not toxic-appearing.  ?HENT:  ?   Head: Normocephalic and atraumatic.  ?Eyes:  ?   General: No scleral icterus. ?Cardiovascular:  ?   Rate and Rhythm: Normal rate and regular rhythm.  ?   Pulses: Normal pulses.  ?   Heart sounds: Normal heart sounds.  ?Pulmonary:  ?   Effort: Pulmonary effort is normal.  ?   Breath sounds: Normal breath sounds.  ?Chest:  ?   Comments: Breast exam is benign.  No masses or nodularity. ?Abdominal:  ?   General: Abdomen is flat. Bowel sounds are normal.  There is no distension.  ?   Palpations: Abdomen is soft.  ?   Tenderness: There is no abdominal tenderness.  ?Musculoskeletal:     ?   General: No swelling.  ?   Cervical back: Neck supple.  ?Lymphadenopathy:  ?   Cervical: No cervical adenopathy.  ?Skin: ?   General: Skin is warm and dry.  ?   Findings: No rash.  ?Neurological:  ?   General: No focal  deficit present.  ?   Mental Status: She is alert.  ?Psychiatric:     ?   Mood and Affect: Mood normal.     ?   Behavior: Behavior normal.  ? ? ?LABORATORY DATA: ? ?CBC ?   ?Component Value Date/Time  ? WBC 8.8 05/25/2020 1600  ? WBC 8.3 05/12/2014 1741  ? RBC 4.16 05/25/2020 1600  ? HGB 12.6 05/25/2020 1600  ? HCT 39.5 05/25/2020 1600  ? PLT 361 05/25/2020 1600  ? MCV 95.0 05/25/2020 1600  ? MCH 30.3 05/25/2020 1600  ? MCHC 31.9 05/25/2020 1600  ? RDW 13.6 05/25/2020 1600  ? LYMPHSABS 2.4 05/25/2020 1600  ? MONOABS 0.4 05/25/2020 1600  ? EOSABS 0.1 05/25/2020 1600  ? BASOSABS 0.0 05/25/2020 1600  ? ? ?CMP  ?   ?Component Value Date/Time  ? NA 140 05/25/2020 1600  ? K 4.1 05/25/2020 1600  ? CL 103 05/25/2020 1600  ? CO2 24 05/25/2020 1600  ? GLUCOSE 118 (H) 05/25/2020 1600  ? BUN 14 05/25/2020 1600  ? CREATININE 0.80 05/25/2020 1600  ? CALCIUM 9.2 05/25/2020 1600  ? PROT 8.0 05/25/2020 1600  ? ALBUMIN 3.7 05/25/2020 1600  ? AST 13 (L) 05/25/2020 1600  ? ALT 12 05/25/2020 1600  ? ALKPHOS 122 05/25/2020 1600  ? BILITOT 0.4 05/25/2020 1600  ? GFRNONAA >60 05/25/2020 1600  ? GFRAA >90 05/12/2014 1741  ? ? ? ?ASSESSMENT and THERAPY PLAN:  ? 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  She was started on anastrozole in April 2022 and has been recommended intensified screening with yearly mammography alternating with MRI 6 months later. ? ?1.  Increased breast cancer risk: We discussed her  recommendations.  She has no sign of breast cancer at today's visit.  She has not undergone the imaging tests we have recommended due to insurance difficulties.  My nurse is reaching out to our imaging locati

## 2021-06-05 NOTE — Assessment & Plan Note (Signed)
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  She was started on anastrozole in April 2022 and has been recommended intensified screening with yearly mammography alternating with MRI 6 months later. ? ?1.  Increased breast cancer risk: We discussed her recommendations.  She has no sign of breast cancer at today's visit.  She has not undergone the imaging tests we have recommended due to insurance difficulties.  My nurse is reaching out to our imaging locations to understand which imaging location will except Friday health insurance.  Since it has been over a year since she has undergone breast mammography she will need to do this first prior to undergoing breast MRI. ? ?Health maintenance we discussed that exercise and healthy diet can help reduce her risk for breast cancer as well.  And we discussed this today. ? ?Memory will return in 1 year for follow-up. ?

## 2021-06-06 ENCOUNTER — Encounter: Payer: Self-pay | Admitting: Adult Health

## 2021-06-11 ENCOUNTER — Telehealth: Payer: Self-pay

## 2021-06-11 NOTE — Telephone Encounter (Signed)
Called and left pt VM to further discuss appointments that need to be scheduled.  Requested a return call ?

## 2021-06-11 NOTE — Telephone Encounter (Signed)
Incoming call from pt, I asked pt to contact her insurance company to determine imaging location for MM and then let us know who is preferred.  Pt verbalized willingness and understanding  ?

## 2021-06-12 ENCOUNTER — Other Ambulatory Visit: Payer: Self-pay

## 2021-06-12 DIAGNOSIS — D0501 Lobular carcinoma in situ of right breast: Secondary | ICD-10-CM

## 2021-06-14 ENCOUNTER — Other Ambulatory Visit: Payer: Self-pay | Admitting: *Deleted

## 2021-06-14 ENCOUNTER — Telehealth: Payer: Self-pay

## 2021-06-14 DIAGNOSIS — Z86000 Personal history of in-situ neoplasm of breast: Secondary | ICD-10-CM

## 2021-06-14 NOTE — Telephone Encounter (Signed)
Telephoned patient at home number. Left a voice message with [facility] contact information. 

## 2021-06-25 ENCOUNTER — Other Ambulatory Visit: Payer: Self-pay

## 2021-06-25 DIAGNOSIS — Z86000 Personal history of in-situ neoplasm of breast: Secondary | ICD-10-CM

## 2021-06-25 MED ORDER — ANASTROZOLE 1 MG PO TABS: 1.0000 mg | ORAL_TABLET | Freq: Every day | ORAL | 4 refills | Status: DC

## 2021-07-03 ENCOUNTER — Ambulatory Visit: Payer: Self-pay | Admitting: *Deleted

## 2021-07-03 ENCOUNTER — Ambulatory Visit
Admission: RE | Admit: 2021-07-03 | Discharge: 2021-07-03 | Disposition: A | Discharge status: Home / Self Care | Payer: No Typology Code available for payment source | Source: Ambulatory Visit | Attending: Obstetrics and Gynecology | Admitting: Obstetrics and Gynecology

## 2021-07-03 ENCOUNTER — Other Ambulatory Visit: Payer: Self-pay | Admitting: Obstetrics and Gynecology

## 2021-07-03 VITALS — BP 130/82 | Wt 214.0 lb

## 2021-07-03 DIAGNOSIS — Z1239 Encounter for other screening for malignant neoplasm of breast: Secondary | ICD-10-CM

## 2021-07-03 DIAGNOSIS — N6310 Unspecified lump in the right breast, unspecified quadrant: Secondary | ICD-10-CM

## 2021-07-03 DIAGNOSIS — N644 Mastodynia: Secondary | ICD-10-CM

## 2021-07-03 DIAGNOSIS — Z86000 Personal history of in-situ neoplasm of breast: Secondary | ICD-10-CM

## 2021-07-03 NOTE — Patient Instructions (Signed)
Explained breast self awareness with [name]. Patient did not need a Pap smear today due to patient has a history of a hysterectomy for benign reasons. Let her know that she doesn't need any further Pap smears due to her history of a hysterectomy for benign reasons. Referred patient to the [facility] for a diagnostic mammogram. Appointment scheduled Tuesday, Jul 03, 2021 at 1240. Patient aware of appointment and will be there. [place] verbalized understanding. ? ?Gittel Mccamish, Arvil [place], RN ?10:28 AM ? ? ? ? ?

## 2021-07-03 NOTE — Progress Notes (Signed)
[name] is a 56 y.o. female who presents to [facility] clinic today with complaint of right outer breast pain x one year that comes and goes. Patient rates the pain at a 4-5 out of 10.  ?  ?Pap Smear: Pap smear not completed today. Last Pap smear was 15 years ago at [facility] clinic and was normal per patient. Per patient has no history of an abnormal Pap smear. Patient has a history of a hysterectomy 11/09/2008 due to fibroids and AUB. Patient doesn't need any further Pap smears due to her history of a hysterectomy for benign reasons per BCCCP and ASCCP guidelines. Last Pap smear result is not available in Epic. ?  ?Physical exam: ?Breasts ?Breasts symmetrical. Scars observed bilateral lower breasts due to history of breast reduction surgery. Patient has a scar right breast at 3 o'clock 3 cm from the nipple due to history of breast abscess. No nipple retraction bilateral breasts. No nipple discharge bilateral breasts. No lymphadenopathy. No lumps palpated bilateral breasts. Complaints right outer breast and left lower outer breast pain on exam.     ? ?Pelvic/Bimanual ?Pap is not indicated today per BCCCP guidelines. ?  ?Smoking History: ?Patient is a former smoker that quit 02/18/1990. ?  ?Patient Navigation: ?Patient education provided. Access to services provided for patient through [facility] program.  ? ?Colorectal Cancer Screening: ?Per patient has had colonoscopy completed on 02/04/2020 at [facility].  No complaints today.  ?  ?Breast and Cervical Cancer Risk Assessment: ?Patient does not have family history of breast cancer. 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 Patient has no known genetic mutations or history of radiation treatment to the chest before age 89. Patient does not have history of cervical dysplasia, immunocompromised, or DES exposure in-utero. ? ?Risk Assessment   ? ? Risk Scores   ? ?   07/03/2021  ? Last edited by: Demetrius Revel, LPN  ? 5-year risk: 1.1 %  ?  Lifetime risk: 5.8 %  ? ?  ?  ? ?  ? ? ?A: ?BCCCP exam without pap smear ?Complaint of right breast pain. ? ?P: ?Referred patient to the [facility] for a diagnostic mammogram. Appointment scheduled Tuesday, Jul 03, 2021 at 1240. ? ?Loletta Parish, RN ?07/03/2021 10:28 AM   ?

## 2021-07-24 ENCOUNTER — Ambulatory Visit: Payer: 59

## 2021-07-31 ENCOUNTER — Telehealth: Payer: Self-pay | Admitting: Adult Health

## 2021-07-31 NOTE — Telephone Encounter (Signed)
.  Called patient to schedule appointment per 4/17 inbasket, patient is aware of date and time.   ?

## 2021-10-22 ENCOUNTER — Other Ambulatory Visit: Payer: Self-pay | Admitting: Adult Health

## 2021-10-22 DIAGNOSIS — Z86000 Personal history of in-situ neoplasm of breast: Secondary | ICD-10-CM

## 2022-01-07 ENCOUNTER — Other Ambulatory Visit: Payer: Self-pay | Admitting: Obstetrics and Gynecology

## 2022-01-07 ENCOUNTER — Ambulatory Visit
Admission: RE | Admit: 2022-01-07 | Discharge: 2022-01-07 | Disposition: A | Discharge status: Home / Self Care | Payer: Commercial Managed Care - HMO | Source: Ambulatory Visit | Attending: Obstetrics and Gynecology | Admitting: Obstetrics and Gynecology

## 2022-01-07 ENCOUNTER — Ambulatory Visit
Admission: RE | Admit: 2022-01-07 | Discharge: 2022-01-07 | Disposition: A | Discharge status: Home / Self Care | Payer: No Typology Code available for payment source | Source: Ambulatory Visit | Attending: Obstetrics and Gynecology | Admitting: Obstetrics and Gynecology

## 2022-01-07 DIAGNOSIS — N6310 Unspecified lump in the right breast, unspecified quadrant: Secondary | ICD-10-CM

## 2022-05-27 ENCOUNTER — Other Ambulatory Visit: Payer: Self-pay | Admitting: Adult Health

## 2022-05-27 DIAGNOSIS — N6310 Unspecified lump in the right breast, unspecified quadrant: Secondary | ICD-10-CM

## 2022-05-29 ENCOUNTER — Telehealth: Payer: Self-pay | Admitting: Adult Health

## 2022-05-29 NOTE — Telephone Encounter (Signed)
Rescheduled appointment per provider meeting. Patient is aware of the changes made to her upcoming appointment.  

## 2022-06-06 ENCOUNTER — Inpatient Hospital Stay: Payer: Commercial Managed Care - HMO | Admitting: Adult Health

## 2022-07-03 ENCOUNTER — Other Ambulatory Visit: Payer: Commercial Managed Care - HMO

## 2022-07-11 ENCOUNTER — Ambulatory Visit
Admission: RE | Admit: 2022-07-11 | Discharge: 2022-07-11 | Disposition: A | Discharge status: Home / Self Care | Payer: PRIVATE HEALTH INSURANCE | Source: Ambulatory Visit | Attending: Obstetrics and Gynecology | Admitting: Obstetrics and Gynecology

## 2022-07-11 DIAGNOSIS — N6310 Unspecified lump in the right breast, unspecified quadrant: Secondary | ICD-10-CM

## 2022-07-18 ENCOUNTER — Encounter: Payer: Self-pay | Admitting: Adult Health

## 2022-07-18 ENCOUNTER — Inpatient Hospital Stay: Payer: Medicaid Other

## 2022-07-18 ENCOUNTER — Inpatient Hospital Stay: Payer: PRIVATE HEALTH INSURANCE | Attending: Adult Health | Admitting: Adult Health

## 2022-07-18 ENCOUNTER — Telehealth: Payer: Self-pay

## 2022-07-18 VITALS — BP 178/84 | HR 79 | Temp 97.7°F | Resp 18 | Wt 208.4 lb

## 2022-07-18 DIAGNOSIS — D0501 Lobular carcinoma in situ of right breast: Secondary | ICD-10-CM | POA: Insufficient documentation

## 2022-07-18 DIAGNOSIS — Z79811 Long term (current) use of aromatase inhibitors: Secondary | ICD-10-CM | POA: Diagnosis not present

## 2022-07-18 DIAGNOSIS — Z808 Family history of malignant neoplasm of other organs or systems: Secondary | ICD-10-CM | POA: Insufficient documentation

## 2022-07-18 DIAGNOSIS — R300 Dysuria: Secondary | ICD-10-CM | POA: Insufficient documentation

## 2022-07-18 DIAGNOSIS — Z13820 Encounter for screening for osteoporosis: Secondary | ICD-10-CM

## 2022-07-18 DIAGNOSIS — Z1239 Encounter for other screening for malignant neoplasm of breast: Secondary | ICD-10-CM | POA: Diagnosis not present

## 2022-07-18 DIAGNOSIS — R399 Unspecified symptoms and signs involving the genitourinary system: Secondary | ICD-10-CM

## 2022-07-18 DIAGNOSIS — Z87891 Personal history of nicotine dependence: Secondary | ICD-10-CM | POA: Diagnosis not present

## 2022-07-18 DIAGNOSIS — N644 Mastodynia: Secondary | ICD-10-CM | POA: Insufficient documentation

## 2022-07-18 DIAGNOSIS — Z800 Family history of malignant neoplasm of digestive organs: Secondary | ICD-10-CM | POA: Insufficient documentation

## 2022-07-18 LAB — URINALYSIS, COMPLETE (UACMP) WITH MICROSCOPIC
Bilirubin Urine: NEGATIVE
Glucose, UA: >=500 mg/dL — AB
Ketones, ur: NEGATIVE mg/dL
Nitrite: NEGATIVE
Protein, ur: NEGATIVE mg/dL
Specific Gravity, Urine: 1.021 (ref 1.005–1.030)
WBC, UA: >50 WBC/hpf (ref 0–5)
pH: 5 (ref 5.0–8.0)

## 2022-07-18 MED ORDER — CIPROFLOXACIN HCL 500 MG PO TABS: 500.0000 mg | ORAL_TABLET | Freq: Two times a day (BID) | ORAL | 0 refills | Status: AC

## 2022-07-18 NOTE — Patient Instructions (Signed)
Breast Tenderness Breast tenderness is a common problem for women of all ages, but may also occur in men. Breast tenderness has many possible causes, including hormone changes, infections, taking certain medicines, and caffeine intake. In women, the pain usually comes and goes with the menstrual cycle, but it can also be constant. Breast tenderness may range from mild discomfort to severe pain. You may have tests, such as a mammogram or an ultrasound, to check for any unusual findings. Having breast tenderness usually does not mean that you have breast cancer. Follow these instructions at home: Managing pain and discomfort  If directed, put ice on the painful area. To do this: Put ice in a plastic bag. Place a towel between your skin and the bag. Leave the ice on for 20 minutes, 2-3 times a day. If your skin turns bright red, remove the ice right away to prevent skin damage. The risk of skin damage is higher if you cannot feel pain, heat, or cold. Wear a supportive bra or chest support: During exercise. While sleeping, if your breasts are very tender. Medicines Take over-the-counter and prescription medicines only as told by your health care provider. If the cause of your pain is an infection, you may be prescribed an antibiotic medicine. If you were prescribed antibiotics, take them as told by your health care provider. Do not stop using the antibiotic even if you start to feel better. Eating and drinking Decrease the amount of caffeine in your diet. Instead, drink more water and choose caffeine-free drinks. Your health care provider may recommend that you lessen the amount of fat in your diet. You can do this by: Limiting fried foods. Cooking foods using methods such as baking, boiling, grilling, and broiling. General instructions  Keep a log of the days and times when your breasts are most tender. Ask your health care provider how to do breast exams at home. This will help you notice if  you have an unusual growth or lump. Keep all follow-up visits. Contact a health care provider if: Any part of your breast is hard, red, and hot to the touch. This may be a sign of infection. You are a woman and have a new or painful lump in your breast that remains after your menstrual period ends. You are not breastfeeding and you have fluid, especially blood or pus, coming out of your nipples. You have a fever. Your pain does not improve or it gets worse. Your pain is interfering with your daily activities. Summary Breast tenderness may range from mild discomfort to severe pain. Breast tenderness has many possible causes, including hormone changes, infections, taking certain medicines, and caffeine intake. It can be treated with ice, wearing a supportive bra or chest support, and medicines. Make changes to your diet as told by your health care provider. This information is not intended to replace advice given to you by your health care provider. Make sure you discuss any questions you have with your health care provider. Document Revised: 04/18/2021 Document Reviewed: 04/18/2021 Elsevier Patient Education  2024 [employer].

## 2022-07-18 NOTE — Telephone Encounter (Signed)
Called pt to discuss UA results per NP. Ordered Cipro 500 mg with clarified orders to 1 tab BID x 5 days disp 10 tabs per NP. Pt understands and knows to call with concerns.

## 2022-07-18 NOTE — Progress Notes (Signed)
[facility] Cancer Follow up:    Suzanne Jungling, NP [address] [place] [address]   DIAGNOSIS: INCREASED RISK FOR BREAST CANCER  SUMMARY OF HIGH RISK HISTORY: [place] woman at high risk for breast cancer on the basis of lobular carcinoma in situ and atypical ductal hyperplasia   (1) anastrozole started 05/29/2020 for risk reduction             (a) bone density November 2021 normal with a T score of +0.6   (2) intensified screening:             (a) yearly mammography with tomography September              (b) yearly breast MRI March             (c) biannual breast exam  CURRENT THERAPY: Anastrozole  INTERVAL HISTORY: [name] 57 y.o. female returns for follow-up of her high risk history.  She continues on anastrozole daily with good tolerance.  Her most recent mammogram occurred Jul 11, 2022 demonstrating no mammographic evidence of malignancy and breast density category B.  There is an area in the right breast they have been following that has been consistent with benign fat necrosis.  She endorses pressure with urination.  She denies dysuria, fever, chills, or urinary frequency.    Patient Active Problem List   Diagnosis Date Noted   Atypical ductal hyperplasia of left breast 05/25/2020   Lobular carcinoma in situ (LCIS) of right breast 05/25/2020   Breast cancer screening, high risk patient 05/25/2020   At high risk for breast cancer 05/25/2020   Hyperlipidemia LDL goal <100 11/25/2019   Type 2 diabetes mellitus without complication, without long-term current use of insulin (HCC) 11/25/2019   Plantar fasciitis of right foot 08/24/2018   OBESITY, NOS 04/17/2006   ANEMIA, ACUTE BLOOD LOSS 04/17/2006   REFLUX ESOPHAGITIS 04/17/2006    has No Known Allergies.  MEDICAL HISTORY: Past Medical History:  Diagnosis Date   Anemia    Carpal tunnel syndrome on left    Chronic lower back pain    Diabetes mellitus without complication  [facility])     SURGICAL HISTORY: Past Surgical History:  Procedure Laterality Date   ANTERIOR CERVICAL DECOMP/DISCECTOMY FUSION  07/19/2008   C4-5; C5-6   BREAST CYST EXCISION Right 05/08/2020   Procedure: CYST EXCISION BREAST;  Surgeon: Allena Napoleon, MD;  Location: [facility];  Service: Plastics;  Laterality: Right;   BREAST REDUCTION SURGERY Bilateral 05/08/2020   Procedure: MAMMARY REDUCTION  (BREAST);  Surgeon: Allena Napoleon, MD;  Location: [facility];  Service: Plastics;  Laterality: Bilateral;   CARPAL TUNNEL RELEASE  02/19/2008   left   POSTERIOR FUSION LUMBAR SPINE  05/03/2011   "& put in 3 screws"   REDUCTION MAMMAPLASTY     SHOULDER ARTHROSCOPY W/ ROTATOR CUFF REPAIR  11/18/2008   left   SHOULDER OPEN ROTATOR CUFF REPAIR  02/18/2010   right   TUBAL LIGATION  10/20/1978   VAGINAL HYSTERECTOMY  10/19/2008   w/partial right salpingectomy    SOCIAL HISTORY: Social History   Socioeconomic History   Marital status: Single    Spouse name: Not on file   Number of children: 2   Years of education: Not on file   Highest education level: High school graduate  Occupational History   Not on file  Tobacco Use   Smoking status: Former    Packs/day:  2.00    Years: 11.00    Additional pack years: 0.00    Total pack years: 22.00    Types: Cigarettes    Quit date: 02/18/1990    Years since quitting: 32.4   Smokeless tobacco: Never  Substance and Sexual Activity   Alcohol use: Yes    Comment: 05/03/11 "drink a little once or twice a year"   Drug use: Yes    Types: Marijuana    Comment: "last marijuana ~ 1992"   Sexual activity: Not Currently    Birth control/protection: Surgical  Other Topics Concern   Not on file  Social History Narrative   Not on file   Social Determinants of Health   Financial Resource Strain: Not on file  Food Insecurity: No Food Insecurity (07/03/2021)   Hunger Vital Sign    Worried About Running Out of Food in the  Last Year: Never true    Ran Out of Food in the Last Year: Never true  Transportation Needs: No Transportation Needs (07/03/2021)   PRAPARE - Administrator, Civil Service (Medical): No    Lack of Transportation (Non-Medical): No  Physical Activity: Not on file  Stress: Not on file  Social Connections: Not on file  Intimate Partner Violence: Not on file    FAMILY HISTORY: Family History  Problem Relation Age of Onset   Stomach cancer Mother    Cancer Maternal Uncle        type unknown   Throat cancer Maternal Grandfather    Breast cancer Neg Hx     Review of Systems  Constitutional:  Negative for appetite change, chills, fatigue, fever and unexpected weight change.  HENT:   Negative for hearing loss, lump/mass and trouble swallowing.   Eyes:  Negative for eye problems and icterus.  Respiratory:  Negative for chest tightness, cough and shortness of breath.   Cardiovascular:  Negative for chest pain, leg swelling and palpitations.  Gastrointestinal:  Negative for abdominal distention, abdominal pain, constipation, diarrhea, nausea and vomiting.  Endocrine: Negative for hot flashes.  Genitourinary:  Negative for difficulty urinating.   Musculoskeletal:  Negative for arthralgias.  Skin:  Negative for itching and rash.  Neurological:  Negative for dizziness, extremity weakness, headaches and numbness.  Hematological:  Negative for adenopathy. Does not bruise/bleed easily.  Psychiatric/Behavioral:  Negative for depression. The patient is not nervous/anxious.       PHYSICAL EXAMINATION   Onc Performance Status - 07/18/22 1158       ECOG Perf Status   ECOG Perf Status Restricted in physically strenuous activity but ambulatory and able to carry out work of a light or sedentary nature, e.g., light house work, office work      KPS SCALE   KPS % SCORE Normal, no compliants, no evidence of disease             Vitals:   07/18/22 1153  BP: (!) 178/84  Pulse: 79   Resp: 18  Temp: 97.7 F (36.5 C)  SpO2: 98%    Physical Exam Constitutional:      General: She is not in acute distress.    Appearance: Normal appearance. She is not toxic-appearing.  HENT:     Head: Normocephalic and atraumatic.     Mouth/Throat:     Mouth: Mucous membranes are moist.     Pharynx: Oropharynx is clear. No oropharyngeal exudate or posterior oropharyngeal erythema.  Eyes:     General: No scleral icterus. Cardiovascular:  Rate and Rhythm: Normal rate and regular rhythm.     Pulses: Normal pulses.     Heart sounds: Normal heart sounds.  Pulmonary:     Effort: Pulmonary effort is normal.     Breath sounds: Normal breath sounds.  Chest:     Comments: Status post bilateral breast reductions, right breast is benign left breast with small area of focal tenderness about 8 cm from the nipple at 3:00.  No distinct nodule noted. Abdominal:     General: Abdomen is flat. Bowel sounds are normal. There is no distension.     Palpations: Abdomen is soft.     Tenderness: There is no abdominal tenderness.  Musculoskeletal:        General: No swelling.     Cervical back: Neck supple.  Lymphadenopathy:     Cervical: No cervical adenopathy.  Skin:    General: Skin is warm and dry.     Findings: No rash.  Neurological:     General: No focal deficit present.     Mental Status: She is alert.  Psychiatric:        Mood and Affect: Mood normal.        Behavior: Behavior normal.     LABORATORY DATA: None today  ASSESSMENT and THERAPY PLAN:   Lobular carcinoma in situ (LCIS) of right breast [name] is a 57 year old woman with lobular carcinoma in situ which puts her at high risk for developing invasive breast cancer.  She continues on anastrozole daily which she began in 2022.  She tolerates this well.  Breast cancer screening: Continue with annual mammograms her insurance has changed so we will order intensified screening with breast MRI as well today. Breast pain: I  recommended that she limit caffeine and high fat food intake such as fast food, greasy food and I recommended plenty of fruits and vegetables and lean proteins. Health maintenance: I recommended she get 5 servings of fruits and vegetables a day in addition I recommended exercise of 150 to 300 minutes a week. Dysuria: I placed orders for urinalysis and urine culture that we can collect today since she is here and having the symptoms.  She will return to clinic in 6 months for follow-up.  She knows to call for any questions or concerns that may arise between now and her next visit.  All questions were answered. The patient knows to call the clinic with any problems, questions or concerns. We can certainly see the patient much sooner if necessary.  Total encounter time:30 minutes*in face-to-face visit time, chart review, lab review, care coordination, order entry, and documentation of the encounter time.  Lillard Anes, NP 07/18/22 12:30 PM Medical Oncology and Hematology [facility] [address] [place], [place] [address] Tel. [phone]    Fax. [phone]  *Total Encounter Time as defined by the Centers for Medicare and Medicaid Services includes, in addition to the face-to-face time of a patient visit (documented in the note above) non-face-to-face time: obtaining and reviewing outside history, ordering and reviewing medications, tests or procedures, care coordination (communications with other health care professionals or caregivers) and documentation in the medical record.

## 2022-07-18 NOTE — Assessment & Plan Note (Signed)
[name] is a 57 year old woman with lobular carcinoma in situ which puts her at high risk for developing invasive breast cancer.  She continues on anastrozole daily which she began in 2022.  She tolerates this well.  Breast cancer screening: Continue with annual mammograms her insurance has changed so we will order intensified screening with breast MRI as well today. Breast pain: I recommended that she limit caffeine and high fat food intake such as fast food, greasy food and I recommended plenty of fruits and vegetables and lean proteins. Health maintenance: I recommended she get 5 servings of fruits and vegetables a day in addition I recommended exercise of 150 to 300 minutes a week. Dysuria: I placed orders for urinalysis and urine culture that we can collect today since she is here and having the symptoms.  She will return to clinic in 6 months for follow-up.  She knows to call for any questions or concerns that may arise between now and her next visit.

## 2022-07-19 LAB — URINE CULTURE

## 2022-07-20 LAB — URINE CULTURE: Culture: >=100000 — AB

## 2022-07-22 ENCOUNTER — Telehealth: Payer: Self-pay | Admitting: *Deleted

## 2022-07-22 NOTE — Telephone Encounter (Signed)
Per Lillard Anes, NP, called pt with message below. Pt is taking ABT for UTI with no side effects noted, as well as contacted PCP to help control diabetes. Advised pt to call office with concerns. Pt verbalized understanding.

## 2022-07-22 NOTE — Telephone Encounter (Signed)
-----   Message from Loa Socks, NP sent at 07/22/2022  8:45 AM EDT ----- Please make sure [name] is taking her antibiotics I sent in for her UTI ----- Message ----- From: Leory Plowman, Lab In [place] Sent: 07/18/2022  12:58 PM EDT To: Loa Socks, NP

## 2022-08-01 ENCOUNTER — Ambulatory Visit (HOSPITAL_BASED_OUTPATIENT_CLINIC_OR_DEPARTMENT_OTHER)
Admission: RE | Admit: 2022-08-01 | Discharge: 2022-08-01 | Disposition: A | Discharge status: Home / Self Care | Payer: PRIVATE HEALTH INSURANCE | Source: Ambulatory Visit | Attending: Adult Health | Admitting: Adult Health

## 2022-08-01 ENCOUNTER — Telehealth: Payer: Self-pay | Admitting: *Deleted

## 2022-08-01 DIAGNOSIS — Z13820 Encounter for screening for osteoporosis: Secondary | ICD-10-CM | POA: Diagnosis present

## 2022-08-01 DIAGNOSIS — Z79811 Long term (current) use of aromatase inhibitors: Secondary | ICD-10-CM | POA: Insufficient documentation

## 2022-08-01 NOTE — Telephone Encounter (Signed)
-----   Message from Loa Socks, NP sent at 08/01/2022 11:27 AM EDT ----- Patient bone density is normal.  Please share with her the good news ----- Message ----- From: Interface, Rad Results In Sent: 08/01/2022  10:38 AM EDT To: Loa Socks, NP

## 2022-08-01 NOTE — Telephone Encounter (Signed)
RN placed call to pt with below message.  Pt verbalized understanding and appreciative of advice.

## 2022-08-23 ENCOUNTER — Encounter: Payer: Self-pay | Admitting: Adult Health

## 2022-08-24 ENCOUNTER — Ambulatory Visit
Admission: RE | Admit: 2022-08-24 | Discharge: 2022-08-24 | Disposition: A | Discharge status: Home / Self Care | Payer: PRIVATE HEALTH INSURANCE | Source: Ambulatory Visit | Attending: Adult Health | Admitting: Adult Health

## 2022-08-24 DIAGNOSIS — D0501 Lobular carcinoma in situ of right breast: Secondary | ICD-10-CM

## 2022-08-24 DIAGNOSIS — Z1239 Encounter for other screening for malignant neoplasm of breast: Secondary | ICD-10-CM

## 2022-08-24 MED ORDER — GADOPICLENOL 0.5 MMOL/ML IV SOLN
10.0000 mL | Freq: Once | INTRAVENOUS | Status: AC | PRN
  Administered 2022-08-24: 10 mL via INTRAVENOUS

## 2022-08-26 ENCOUNTER — Encounter: Payer: Self-pay | Admitting: Adult Health

## 2022-08-26 ENCOUNTER — Other Ambulatory Visit: Payer: Self-pay | Admitting: Adult Health

## 2022-08-26 DIAGNOSIS — N6342 Unspecified lump in left breast, subareolar: Secondary | ICD-10-CM

## 2022-08-27 ENCOUNTER — Other Ambulatory Visit: Payer: Self-pay | Admitting: Adult Health

## 2022-08-27 DIAGNOSIS — R928 Other abnormal and inconclusive findings on diagnostic imaging of breast: Secondary | ICD-10-CM

## 2022-08-27 DIAGNOSIS — N6092 Unspecified benign mammary dysplasia of left breast: Secondary | ICD-10-CM

## 2022-08-27 NOTE — Progress Notes (Signed)
I reviewed [name]'s breast MRI results with her.  Orders were placed for left breast ultrasound as recommended in the MRI.  [name] verbalized understanding and is in agreement with this.  Lillard Anes, NP 08/27/22 1:50 PM Medical Oncology and Hematology [facility] [address] [place], [place] [address] Tel. [phone]    Fax. [phone]

## 2022-09-11 ENCOUNTER — Ambulatory Visit
Admission: RE | Admit: 2022-09-11 | Discharge: 2022-09-11 | Disposition: A | Discharge status: Home / Self Care | Payer: PRIVATE HEALTH INSURANCE | Source: Ambulatory Visit | Attending: Adult Health | Admitting: Adult Health

## 2022-09-11 DIAGNOSIS — N6342 Unspecified lump in left breast, subareolar: Secondary | ICD-10-CM

## 2022-10-07 ENCOUNTER — Ambulatory Visit: Payer: PRIVATE HEALTH INSURANCE

## 2022-11-11 ENCOUNTER — Other Ambulatory Visit: Payer: Self-pay | Admitting: Adult Health

## 2022-11-11 DIAGNOSIS — Z86000 Personal history of in-situ neoplasm of breast: Secondary | ICD-10-CM

## 2022-11-20 ENCOUNTER — Telehealth: Payer: Self-pay | Admitting: Adult Health

## 2022-11-20 NOTE — Telephone Encounter (Signed)
Left patient a message in regards to rescheduled appointment times/dates due to provider being out of oofice on 11/29

## 2023-01-13 ENCOUNTER — Inpatient Hospital Stay: Payer: No Typology Code available for payment source | Attending: Adult Health | Admitting: Adult Health

## 2023-01-17 ENCOUNTER — Ambulatory Visit: Payer: Medicaid Other | Admitting: Adult Health

## 2023-03-31 ENCOUNTER — Telehealth: Payer: Self-pay | Admitting: Adult Health

## 2023-03-31 NOTE — Telephone Encounter (Signed)
Called and spoke with patient to reschedule appt.

## 2023-04-04 ENCOUNTER — Telehealth: Payer: Self-pay | Admitting: Adult Health

## 2023-04-04 ENCOUNTER — Inpatient Hospital Stay: Payer: No Typology Code available for payment source | Attending: Adult Health | Admitting: Adult Health

## 2023-04-04 ENCOUNTER — Encounter: Payer: Self-pay | Admitting: Adult Health

## 2023-04-04 VITALS — BP 135/67 | HR 87 | Temp 98.2°F | Resp 17 | Ht 66.0 in | Wt 201.8 lb

## 2023-04-04 DIAGNOSIS — M7989 Other specified soft tissue disorders: Secondary | ICD-10-CM | POA: Diagnosis not present

## 2023-04-04 DIAGNOSIS — Z79811 Long term (current) use of aromatase inhibitors: Secondary | ICD-10-CM | POA: Diagnosis not present

## 2023-04-04 DIAGNOSIS — Z808 Family history of malignant neoplasm of other organs or systems: Secondary | ICD-10-CM | POA: Diagnosis not present

## 2023-04-04 DIAGNOSIS — Z87891 Personal history of nicotine dependence: Secondary | ICD-10-CM | POA: Diagnosis not present

## 2023-04-04 DIAGNOSIS — Z800 Family history of malignant neoplasm of digestive organs: Secondary | ICD-10-CM | POA: Insufficient documentation

## 2023-04-04 DIAGNOSIS — D0501 Lobular carcinoma in situ of right breast: Secondary | ICD-10-CM | POA: Diagnosis not present

## 2023-04-04 DIAGNOSIS — E119 Type 2 diabetes mellitus without complications: Secondary | ICD-10-CM | POA: Diagnosis not present

## 2023-04-04 DIAGNOSIS — Z9189 Other specified personal risk factors, not elsewhere classified: Secondary | ICD-10-CM | POA: Diagnosis not present

## 2023-04-04 DIAGNOSIS — N6091 Unspecified benign mammary dysplasia of right breast: Secondary | ICD-10-CM | POA: Insufficient documentation

## 2023-04-04 NOTE — Assessment & Plan Note (Signed)
She very is a 58 year old woman who is here today for follow-up of her increased risk for breast cancer development due to her history of lobular carcinoma in situ and atypical ductal hyperplasia.  She was started on anastrozole in April 2022 and has been recommended intensified screening with yearly mammography alternating with MRI 6 months later.  Breast Cancer Risk On Anastrozole. No breast changes noted on exam. Mammogram and ultrasound recommended in May. -Continue Anastrozole. -Order mammogram and ultrasound for May 2025.  Diabetes On Ozempic for 7 months. Reports constipation. No recent A1c available. -Continue Ozempic. -Recommend Magnesium Oxide and Miralax if needed for constipation. -Check A1c with PCP  General Health Maintenance Reports daily walking and adequate intake of fruits and vegetables. -Continue current exercise and dietary habits. -Follow-up in 1 year or sooner if any breast changes or problems with Anastrozole.

## 2023-04-04 NOTE — Telephone Encounter (Signed)
Scheduled appointment per 2/14 los. Patient is aware of the made appointment.

## 2023-04-04 NOTE — Progress Notes (Signed)
[facility] Cancer Follow up:    Suzanne Jungling, NP 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  [name] 58 y.o. female , with a history of diabetes and increased risk for breast cancer, presents with constipation and shoulder pain. She has been on Ozempic for her diabetes for about seven months, which she believes may be causing her constipation. She reports having bowel movements only one or two times a week, despite drinking plenty of water. She has tried taking Align to help with her bowel movements.  In addition to her constipation, she is experiencing shoulder pain. She had shoulder surgery in the past and is unsure if the pain is related to that. She also mentions experiencing hot flashes, but it is unclear if this is a new or ongoing issue.  For her increased risk of breast cancer, she is taking anastrozole and had a mammogram in May, which showed fat necrosis and stress. She also had an MRI in July. She is due for a repeat mammogram and ultrasound in May.   Patient Active Problem List   Diagnosis Date Noted   Atypical ductal hyperplasia of left breast 05/25/2020   Lobular carcinoma in situ (LCIS) of right breast 05/25/2020   Breast cancer screening, high risk patient 05/25/2020    At high risk for breast cancer 05/25/2020   Hyperlipidemia LDL goal <100 11/25/2019   Type 2 diabetes mellitus without complication, without long-term current use of insulin (HCC) 11/25/2019   Plantar fasciitis of right foot 08/24/2018   OBESITY, NOS 04/17/2006   ANEMIA, ACUTE BLOOD LOSS 04/17/2006   REFLUX ESOPHAGITIS 04/17/2006    has no known allergies.  MEDICAL HISTORY: Past Medical History:  Diagnosis Date   Anemia    Carpal tunnel syndrome on left    Chronic lower back pain    Diabetes mellitus without complication [facility])     SURGICAL HISTORY: Past Surgical History:  Procedure Laterality Date   ANTERIOR CERVICAL DECOMP/DISCECTOMY FUSION  07/19/2008   C4-5; C5-6   BREAST CYST EXCISION Right 05/08/2020   Procedure: CYST EXCISION BREAST;  Surgeon: Allena Napoleon, MD;  Location: [facility];  Service: Plastics;  Laterality: Right;   BREAST REDUCTION SURGERY Bilateral 05/08/2020   Procedure: MAMMARY REDUCTION  (BREAST);  Surgeon: Allena Napoleon, MD;  Location: [facility];  Service: Plastics;  Laterality: Bilateral;   CARPAL TUNNEL RELEASE  02/19/2008   left   POSTERIOR FUSION LUMBAR SPINE  05/03/2011   "& put in 3 screws"   REDUCTION MAMMAPLASTY     SHOULDER ARTHROSCOPY  W/ ROTATOR CUFF REPAIR  11/18/2008   left   SHOULDER OPEN ROTATOR CUFF REPAIR  02/18/2010   right   TUBAL LIGATION  10/20/1978   VAGINAL HYSTERECTOMY  10/19/2008   w/partial right salpingectomy    SOCIAL HISTORY: Social History   Socioeconomic History   Marital status: Single    Spouse name: Not on file   Number of children: 2   Years of education: Not on file   Highest education level: High school graduate  Occupational History   Not on file  Tobacco Use   Smoking status: Former    Current packs/day: 0.00    Average packs/day: 2.0 packs/day for 11.0 years (22.0 ttl pk-yrs)    Types: Cigarettes    Start date: 02/19/1979    Quit date: 02/18/1990    Years since  quitting: 33.1   Smokeless tobacco: Never  Substance and Sexual Activity   Alcohol use: Yes    Comment: 05/03/11 "drink a little once or twice a year"   Drug use: Yes    Types: Marijuana    Comment: "last marijuana ~ 1992"   Sexual activity: Not Currently    Birth control/protection: Surgical  Other Topics Concern   Not on file  Social History Narrative   Not on file   Social Drivers of Health   Financial Resource Strain: Low Risk  (02/25/2022)   [facility]   Overall Financial Resource Strain (CARDIA)    Difficulty of Paying Living Expenses: Not hard at all  Food Insecurity: No Food Insecurity (02/25/2022)   [facility]   Hunger Vital Sign    Worried About Running Out of Food in the Last Year: Never true    Ran Out of Food in the Last Year: Never true  Transportation Needs: No Transportation Needs (02/25/2022)   [facility]   PRAPARE - Transportation    Lack of Transportation (Medical): No    Lack of Transportation (Non-Medical): No  Physical Activity: Insufficiently Active (02/25/2022)   [facility]   Exercise Vital Sign    Days of Exercise per Week: 2 days    Minutes of Exercise per Session: 30 min  Stress: No Stress Concern Present (02/25/2022)   Received from [facility], [facility], [facility]   [facility] - Occupational Stress Questionnaire    Feeling of Stress : Only a little  Social Connections: Socially Integrated (02/25/2022)   [facility]   Social Network    How would you rate your social network (family, work, friends)?: Good participation with social networks  Intimate Partner Violence: Not At Risk (02/25/2022)   Received from [facility]   HITS    Over the last 12 months how often did your partner  physically hurt you?: 1    Over the last 12 months how often did your partner insult you or talk down to you?: 1    Over the last 12 months how often did your partner threaten you with physical harm?: 1    Over the last 12 months how often did your partner scream or curse at you?: 1    FAMILY HISTORY: Family History  Problem Relation Age of Onset   Stomach cancer Mother    Cancer Maternal Uncle        type unknown   Throat cancer  Maternal Grandfather    Breast cancer Neg Hx     Review of Systems  Constitutional:  Negative for appetite change, chills, fatigue, fever and unexpected weight change.  HENT:   Negative for hearing loss, lump/mass and trouble swallowing.   Eyes:  Negative for eye problems and icterus.  Respiratory:  Negative for chest tightness, cough and shortness of breath.   Cardiovascular:  Negative for chest pain, leg swelling and palpitations.  Gastrointestinal:  Positive for constipation. Negative for abdominal distention, abdominal pain, blood in stool, diarrhea, nausea, rectal pain and vomiting.  Endocrine: Positive for hot flashes.  Genitourinary:  Negative for difficulty urinating.   Musculoskeletal:  Negative for arthralgias.  Skin:  Negative for itching and rash.  Neurological:  Negative for dizziness, extremity weakness, headaches and numbness.  Hematological:  Negative for adenopathy. Does not bruise/bleed easily.  Psychiatric/Behavioral:  Negative for depression. The patient is not nervous/anxious.       PHYSICAL EXAMINATION    Vitals:   04/04/23 1026  BP: 135/67  Pulse: 87  Resp: 17  Temp: 98.2 F (36.8 C)  SpO2: 100%    Physical Exam Constitutional:      General: She is not in acute distress.    Appearance: Normal appearance. She is not toxic-appearing.  HENT:     Head: Normocephalic and atraumatic.     Mouth/Throat:     Mouth: Mucous membranes are moist.     Pharynx: Oropharynx is clear. No oropharyngeal exudate or posterior  oropharyngeal erythema.  Eyes:     General: No scleral icterus. Cardiovascular:     Rate and Rhythm: Normal rate and regular rhythm.     Pulses: Normal pulses.     Heart sounds: Normal heart sounds.  Pulmonary:     Effort: Pulmonary effort is normal.     Breath sounds: Normal breath sounds.  Chest:     Comments: Bilateral breast exam benign Abdominal:     General: Abdomen is flat. Bowel sounds are normal. There is no distension.     Palpations: Abdomen is soft.     Tenderness: There is no abdominal tenderness.  Musculoskeletal:        General: No swelling.     Cervical back: Neck supple.  Lymphadenopathy:     Cervical: No cervical adenopathy.     Upper Body:     Right upper body: No axillary adenopathy.     Left upper body: No axillary adenopathy.  Skin:    General: Skin is warm and dry.     Findings: No rash.  Neurological:     General: No focal deficit present.     Mental Status: She is alert.  Psychiatric:        Mood and Affect: Mood normal.        Behavior: Behavior normal.      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  She was started on anastrozole in April 2022 and has been recommended intensified screening with yearly mammography alternating with MRI 6 months later.  Breast Cancer Risk On Anastrozole. No breast changes noted on exam. Mammogram and ultrasound recommended in May. -Continue Anastrozole. -Order mammogram and ultrasound for May 2025.  Diabetes On Ozempic for 7 months. Reports constipation. No recent A1c available. -Continue Ozempic. -Recommend Magnesium  Oxide and Miralax if needed for constipation. -Check A1c with PCP  General Health Maintenance Reports daily walking and adequate intake of fruits and vegetables. -Continue  current exercise and dietary habits. -Follow-up in 1 year or sooner if any breast changes or problems with Anastrozole.   All questions were answered. The patient knows to call the clinic with any problems, questions or concerns. We can certainly see the patient much sooner if necessary.  Total encounter time:20 minutes*in face-to-face visit time, chart review, lab review, care coordination, order entry, and documentation of the encounter time.    Suzanne Anes, NP 04/04/23 12:53 PM Medical Oncology and Hematology [facility] [address] [place], [place] [address] Tel. [phone]    Fax. [phone]  *Total Encounter Time as defined by the Centers for Medicare and Medicaid Services includes, in addition to the face-to-face time of a patient visit (documented in the note above) non-face-to-face time: obtaining and reviewing outside history, ordering and reviewing medications, tests or procedures, care coordination (communications with other health care professionals or caregivers) and documentation in the medical record.

## 2023-07-10 ENCOUNTER — Telehealth: Payer: Self-pay | Admitting: *Deleted

## 2023-07-10 NOTE — Telephone Encounter (Addendum)
 Pt called stating that the [facility] no longer accepted her insurance. Pt gave [facility] as a facility that accepts her insurance. Orders have been faxed to [phone]. Confirmation received

## 2023-07-15 ENCOUNTER — Other Ambulatory Visit: Payer: No Typology Code available for payment source

## 2023-11-12 ENCOUNTER — Other Ambulatory Visit: Payer: Self-pay | Admitting: Adult Health

## 2023-11-12 DIAGNOSIS — Z86000 Personal history of in-situ neoplasm of breast: Secondary | ICD-10-CM

## 2024-04-05 ENCOUNTER — Inpatient Hospital Stay: Payer: No Typology Code available for payment source | Admitting: Adult Health
